# Patient Record
Sex: Female | Born: 1975 | Race: Black or African American | Hispanic: No | Marital: Single | Smoking: Never smoker
Health system: Southern US, Community
[De-identification: ages and names within clinical notes are randomized; demographics above are authoritative.]

## PROBLEM LIST (undated history)

## (undated) ENCOUNTER — Emergency Department (HOSPITAL_COMMUNITY): Discharge status: Absent without leave | Payer: Self-pay

## (undated) DIAGNOSIS — E119 Type 2 diabetes mellitus without complications: Secondary | ICD-10-CM

## (undated) DIAGNOSIS — F419 Anxiety disorder, unspecified: Secondary | ICD-10-CM

## (undated) HISTORY — PX: WISDOM TOOTH EXTRACTION: SHX21

---

## 2003-01-20 ENCOUNTER — Ambulatory Visit (HOSPITAL_COMMUNITY): Admission: RE | Admit: 2003-01-20 | Discharge: 2003-01-20 | Payer: Self-pay | Admitting: Obstetrics and Gynecology

## 2004-10-06 ENCOUNTER — Other Ambulatory Visit: Admission: RE | Admit: 2004-10-06 | Discharge: 2004-10-06 | Payer: Self-pay | Admitting: Obstetrics and Gynecology

## 2012-03-02 ENCOUNTER — Emergency Department (HOSPITAL_BASED_OUTPATIENT_CLINIC_OR_DEPARTMENT_OTHER): Payer: Self-pay

## 2012-03-02 ENCOUNTER — Encounter (HOSPITAL_BASED_OUTPATIENT_CLINIC_OR_DEPARTMENT_OTHER): Payer: Self-pay

## 2012-03-02 ENCOUNTER — Emergency Department (HOSPITAL_BASED_OUTPATIENT_CLINIC_OR_DEPARTMENT_OTHER)
Admission: EM | Admit: 2012-03-02 | Discharge: 2012-03-02 | Disposition: A | Discharge status: Home / Self Care | Payer: Self-pay | Attending: Emergency Medicine | Admitting: Emergency Medicine

## 2012-03-02 DIAGNOSIS — G5600 Carpal tunnel syndrome, unspecified upper limb: Secondary | ICD-10-CM | POA: Insufficient documentation

## 2012-03-02 DIAGNOSIS — Z79899 Other long term (current) drug therapy: Secondary | ICD-10-CM | POA: Insufficient documentation

## 2012-03-02 DIAGNOSIS — E119 Type 2 diabetes mellitus without complications: Secondary | ICD-10-CM | POA: Insufficient documentation

## 2012-03-02 HISTORY — DX: Type 2 diabetes mellitus without complications: E11.9

## 2012-03-02 LAB — BASIC METABOLIC PANEL
BUN: 8 mg/dL (ref 6–23)
CO2: 26 mEq/L (ref 19–32)
Calcium: 9.9 mg/dL (ref 8.4–10.5)
Chloride: 98 mEq/L (ref 96–112)
Creatinine, Ser: 0.8 mg/dL (ref 0.50–1.10)
GFR calc Af Amer: >90 mL/min (ref >90)
GFR calc non Af Amer: >90 mL/min (ref >90)
Glucose, Bld: 113 mg/dL — ABNORMAL HIGH (ref 70–99)
Potassium: 3.9 mEq/L (ref 3.5–5.1)
Sodium: 137 mEq/L (ref 135–145)

## 2012-03-02 LAB — D-DIMER, QUANTITATIVE: D-Dimer, Quant: 0.27 ug/mL-FEU (ref 0.00–0.48)

## 2012-03-02 MED ORDER — IBUPROFEN 800 MG PO TABS
800.0000 mg | ORAL_TABLET | Freq: Once | ORAL | Status: AC
  Administered 2012-03-02: 800 mg via ORAL
  Filled 2012-03-02: qty 1

## 2012-03-02 NOTE — ED Provider Notes (Signed)
History     CSN: [id number]  Arrival date & time 03/02/12  4098   First MD Initiated Contact with Patient 03/02/12 [phone]      Chief Complaint  Patient presents with  . Hand Pain    (Consider location/radiation/quality/duration/timing/severity/associated sxs/prior treatment) HPI Patient with left hand pain with some swelling noted past several nights.  Swelling and pain resolve during day.  Pain at left elbow and radiates to first three fingers.  Pain is worse at night and feels tingly.  Patient shakes wrist to relieve.  Today noted swelling of hand.  No known trauma, no bcp, no history of dvt or pe, no known immobilization or compression of lue.   Past Medical History  Diagnosis Date  . Diabetes mellitus without complication     Past Surgical History  Procedure Date  . Wisdom tooth extraction     History reviewed. No pertinent family history.  History  Substance Use Topics  . Smoking status: Never Smoker   . Smokeless tobacco: Never Used  . Alcohol Use: Yes     Comment: socially    OB History    Grav Para Term Preterm Abortions TAB SAB Ect Mult Living                  Review of Systems  All other systems reviewed and are negative.    Allergies  Percocet; Sulfa antibiotics; Tramadol; and Vicodin  Home Medications   Current Outpatient Rx  Name  Route  Sig  Dispense  Refill  . METFORMIN HCL ER (MOD) 500 MG PO TB24   Oral   Take 500 mg by mouth daily with breakfast.           BP 143/95  Pulse 104  Temp 97.9 F (36.6 C) (Oral)  Resp 20  Ht 5' (1.524 m)  Wt 170 lb (77.111 kg)  BMI 33.20 kg/m2  SpO2 99%  LMP 02/01/2012  Physical Exam  Nursing note and vitals reviewed. Constitutional: She is oriented to person, place, and time. She appears well-developed and well-nourished.  HENT:  Head: Normocephalic and atraumatic.  Eyes: Conjunctivae normal and EOM are normal. Pupils are equal, round, and reactive to light.  Neck: Normal range of motion.    Cardiovascular: Normal rate, regular rhythm and normal heart sounds.   Pulmonary/Chest: Effort normal and breath sounds normal.  Abdominal: Soft. Bowel sounds are normal.  Musculoskeletal:       Mild swelling of dorsal aspect of left hand with difuse ttp, positive tinel's and phalen's sign.  Full arom of elbow and shoulder. Fingers are pink with cap refill < 2 seconds.    Neurological: She is alert and oriented to person, place, and time. She has normal reflexes.  Skin: Skin is warm and dry.  Psychiatric: She has a normal mood and affect. Her behavior is normal. Judgment and thought content normal.    ED Course  Procedures (including critical care time)  Labs Reviewed  BASIC METABOLIC PANEL - Abnormal; Notable for the following:    Glucose, Bld 113 (*)     All other components within normal limits  D-DIMER, QUANTITATIVE   Dg Hand Complete Left  03/02/2012  *RADIOLOGY REPORT*  Clinical Data: Pain  LEFT HAND - COMPLETE 3+ VIEW  Comparison: None.  Findings: Three views of the left hand submitted.  No acute fracture or subluxation.  No periosteal reaction or bony erosion.  IMPRESSION: No acute fracture or subluxation.   Original Report Authenticated By: Natasha Mead,  M.D.      No diagnosis found.    MDM  Patient with symptoms c.w. Carpal tunnel syndrome, but some swelling noted.  Patient had negativ ed-dimer and is placed in cock up splint and advised with referral to Dr Pearletha Forge.       Hilario Quarry, MD 03/02/12 [phone]

## 2012-03-02 NOTE — ED Notes (Signed)
Pt states that she has severe pain in the L hand, pt states that she woke up this morning with numbness and tingling like her hand was asleep.  Pt states that she bought a brace and put it on hand/wrist and it hurt, so she removed it.  Pt states it was swollen this morning, this swelling is still present but has decreased since earlier this morning.

## 2012-03-03 ENCOUNTER — Encounter (HOSPITAL_BASED_OUTPATIENT_CLINIC_OR_DEPARTMENT_OTHER): Payer: Self-pay | Admitting: Emergency Medicine

## 2012-03-03 ENCOUNTER — Emergency Department (HOSPITAL_BASED_OUTPATIENT_CLINIC_OR_DEPARTMENT_OTHER)
Admission: EM | Admit: 2012-03-03 | Discharge: 2012-03-03 | Disposition: A | Discharge status: Home / Self Care | Payer: Self-pay | Attending: Emergency Medicine | Admitting: Emergency Medicine

## 2012-03-03 DIAGNOSIS — G5600 Carpal tunnel syndrome, unspecified upper limb: Secondary | ICD-10-CM | POA: Insufficient documentation

## 2012-03-03 DIAGNOSIS — Z79899 Other long term (current) drug therapy: Secondary | ICD-10-CM | POA: Insufficient documentation

## 2012-03-03 DIAGNOSIS — E119 Type 2 diabetes mellitus without complications: Secondary | ICD-10-CM | POA: Insufficient documentation

## 2012-03-03 LAB — URIC ACID: Uric Acid, Serum: 5.2 mg/dL (ref 2.4–7.0)

## 2012-03-03 MED ORDER — TRAMADOL HCL 50 MG PO TABS: 50.0000 mg | ORAL_TABLET | Freq: Four times a day (QID) | ORAL | Status: AC | PRN

## 2012-03-03 MED ORDER — KETOROLAC TROMETHAMINE 60 MG/2ML IM SOLN
INTRAMUSCULAR | Status: AC
  Administered 2012-03-03: 60 mg via INTRAMUSCULAR
  Filled 2012-03-03: qty 2

## 2012-03-03 MED ORDER — KETOROLAC TROMETHAMINE 60 MG/2ML IM SOLN
60.0000 mg | Freq: Once | INTRAMUSCULAR | Status: AC
  Administered 2012-03-03: 60 mg via INTRAMUSCULAR

## 2012-03-03 MED ORDER — MELOXICAM 7.5 MG PO TABS: 7.5000 mg | ORAL_TABLET | Freq: Every day | ORAL | Status: DC

## 2012-03-03 NOTE — ED Notes (Signed)
Pt educated to take benadryl prior to taking tramadol medication for itching allergy

## 2012-03-03 NOTE — ED Notes (Signed)
Pt continues to complain of left hand pain, no change in pain since patient was seen this am

## 2012-03-03 NOTE — ED Notes (Signed)
Pt requesting to have script for tramadol written for pain control, informed that she has listed tramadol an allergy with itching reported effect. Pt states that the tramadol makes her itch the least out of all pain medications and was informed of possible risk of medication and was able to return acceptance of knowledge of reaction to medication and assumed risk

## 2012-03-03 NOTE — ED Provider Notes (Signed)
History     CSN: [id number]  Arrival date & time 03/03/12  0228   First MD Initiated Contact with Patient 03/03/12 0249      Chief Complaint  Patient presents with  . Hand Pain    (Consider location/radiation/quality/duration/timing/severity/associated sxs/prior treatment) Patient is a 37 y.o. female presenting with hand pain. The history is provided by the patient. No language interpreter was used.  Hand Pain This is a new problem. The current episode started more than 2 days ago. The problem occurs constantly. The problem has not changed since onset.Pertinent negatives include no chest pain, no abdominal pain, no headaches and no shortness of breath. Nothing aggravates the symptoms. Nothing relieves the symptoms. Treatments tried: ibuprofen. The treatment provided no relief.    Past Medical History  Diagnosis Date  . Diabetes mellitus without complication     Past Surgical History  Procedure Date  . Wisdom tooth extraction     History reviewed. No pertinent family history.  History  Substance Use Topics  . Smoking status: Never Smoker   . Smokeless tobacco: Never Used  . Alcohol Use: Yes     Comment: socially    OB History    Grav Para Term Preterm Abortions TAB SAB Ect Mult Living                  Review of Systems  Respiratory: Negative for shortness of breath.   Cardiovascular: Negative for chest pain.  Gastrointestinal: Negative for abdominal pain.  Musculoskeletal: Negative for joint swelling.  Neurological: Negative for headaches.  All other systems reviewed and are negative.    Allergies  Percocet; Sulfa antibiotics; Tramadol; and Vicodin  Home Medications   Current Outpatient Rx  Name  Route  Sig  Dispense  Refill  . METFORMIN HCL ER (MOD) 500 MG PO TB24   Oral   Take 500 mg by mouth daily with breakfast.           BP 148/110  Pulse 88  Temp 98.6 F (37 C)  Resp 18  SpO2 100%  LMP 02/01/2012  Physical Exam  Constitutional: She  is oriented to person, place, and time. She appears well-developed and well-nourished. No distress.  HENT:  Head: Normocephalic and atraumatic.  Eyes: Conjunctivae normal are normal. Pupils are equal, round, and reactive to light.  Neck: Normal range of motion. Neck supple.  Cardiovascular: Normal rate, regular rhythm and intact distal pulses.   Pulmonary/Chest: Effort normal and breath sounds normal. She has no wheezes. She has no rales.  Abdominal: Soft. Bowel sounds are normal. There is no tenderness. There is no rebound.  Musculoskeletal: Normal range of motion. She exhibits no edema.       Left wrist and hand neurovascularly intact.  5/5 strength, cap refill < 2 sec to all digits of the hand.  No swelling warmth or erythema on exam.  No snuff box tenderness of the wrist.  Positive tinel and phalen test  Neurological: She is alert and oriented to person, place, and time.  Skin: Skin is warm and dry. No rash noted. No erythema. No pallor.  Psychiatric: She has a normal mood and affect.    ED Course  Procedures (including critical care time)   Labs Reviewed  URIC ACID   Dg Hand Complete Left  03/02/2012  *RADIOLOGY REPORT*  Clinical Data: Pain  LEFT HAND - COMPLETE 3+ VIEW  Comparison: None.  Findings: Three views of the left hand submitted.  No acute fracture or subluxation.  No periosteal reaction or bony erosion.  IMPRESSION: No acute fracture or subluxation.   Original Report Authenticated By: Natasha Mead, M.D.      No diagnosis found.    MDM  Advised continued use of wrist splint and ice and elevation and use of NSAIDs as first line therapy.  Patient states that ultram causing only mild itching and that she is willing to accept the risk of allergic reaction as pain is severe and will take the medication with benadryl and return for any concerning symptoms.  Will also give RX for meloxicam.  Agree with referral to Dr. Pearletha Forge       Morene Antu Smitty Cords, MD 03/03/12 [phone]

## 2012-03-06 ENCOUNTER — Encounter: Payer: Self-pay | Admitting: Family Medicine

## 2012-03-06 ENCOUNTER — Ambulatory Visit (INDEPENDENT_AMBULATORY_CARE_PROVIDER_SITE_OTHER): Payer: Self-pay | Admitting: Family Medicine

## 2012-03-06 ENCOUNTER — Ambulatory Visit: Payer: Self-pay | Admitting: Family Medicine

## 2012-03-06 VITALS — BP 163/118 | HR 84 | Ht 60.0 in | Wt 175.0 lb

## 2012-03-06 DIAGNOSIS — G5600 Carpal tunnel syndrome, unspecified upper limb: Secondary | ICD-10-CM

## 2012-03-06 DIAGNOSIS — G5602 Carpal tunnel syndrome, left upper limb: Secondary | ICD-10-CM

## 2012-03-06 MED ORDER — MELOXICAM 7.5 MG PO TABS: 7.5000 mg | ORAL_TABLET | Freq: Every day | ORAL | Status: AC

## 2012-03-06 NOTE — Patient Instructions (Addendum)
You have carpal tunnel syndrome. Wear the wrist splint at nighttime and as often as possible during the day Ibuprofen 800 mg three times a day OR meloxicam once a day with food. Corticosteroid injection is a consideration to help with pain and inflammation - you were given this today. If not improving, will consider nerve conduction studies to assess severity. Surgery tends to work well for this if you do not improve with conservative care. Follow up with me in 1 month for reevaluation.

## 2012-03-07 ENCOUNTER — Encounter: Payer: Self-pay | Admitting: Family Medicine

## 2012-03-07 DIAGNOSIS — G5602 Carpal tunnel syndrome, left upper limb: Secondary | ICD-10-CM | POA: Insufficient documentation

## 2012-03-07 NOTE — Assessment & Plan Note (Signed)
Left carpal tunnel syndrome - sustained at work from overuse.  Is using cockup wrist brace, elevation, nsaids.  Went ahead with carpal tunnel injection today.  F/u in 1 month for reevaluation.  May need restrictions at work but she would like to try to continue working without them if possible.    After informed written consent patient was seated in a chair.  Ultrasound used to identify ulnar artery and median nerve then marked injection location between these.  Alcohol swab used to prep area then carpal tunnel injected with 1:1 marcaine: depomedrol.  Patient tolerated procedure well without immediate complications.

## 2012-03-07 NOTE — Progress Notes (Signed)
Subjective:    Patient ID: Kelsey [name], female    DOB: 07/20/1975, 37 y.o.   MRN: [id number]  PCP: None  HPI 37 yo F here for [name] hand pain, numbness.  Patient reports on 1/22 while at work she developed [name] volar wrist pain and [name] hand went numb. She tried shaking hand which helped some. Thursday had same symptoms but fingers went numb except for pinky finger. Is right handed. Does a lot of typing at work. Started wearing a wrist brace on Friday and using this all day. Feels better to use hot water over this. Has been elevating. Tried mobic, tramadol. No neck pain. Difficulty picking items up with this hand.  Past Medical History  Diagnosis Date  . Diabetes mellitus without complication     Current Outpatient Prescriptions on File Prior to Visit  Medication Sig Dispense Refill  . metFORMIN (GLUMETZA) 500 MG (MOD) 24 hr tablet Take 500 mg by mouth daily with breakfast.      . traMADol (ULTRAM) 50 MG tablet Take 1 tablet (50 mg total) by mouth every 6 (six) hours as needed for pain.  9 tablet  0    Past Surgical History  Procedure Date  . Wisdom tooth extraction     Allergies  Allergen Reactions  . Percocet (Oxycodone-Acetaminophen) Itching  . Sulfa Antibiotics Itching  . Tramadol Itching  . Vicodin (Hydrocodone-Acetaminophen) Itching    History   Social History  . Marital Status: Single    Spouse Name: N/A    Number of Children: N/A  . Years of Education: N/A   Occupational History  . Not on file.   Social History Main Topics  . Smoking status: Never Smoker   . Smokeless tobacco: Never Used  . Alcohol Use: Yes     Comment: socially  . Drug Use: Yes    Special: Marijuana  . Sexually Active: Not on file   Other Topics Concern  . Not on file   Social History Narrative  . No narrative on file    Family History  Problem Relation Age of Onset  . Hypertension Mother   . Hyperlipidemia Mother   . Hypertension Father   . Diabetes Father   .  Hyperlipidemia Father   . Hypertension Sister   . Hypertension Brother   . Diabetes Brother   . Heart attack Neg Hx   . Sudden death Neg Hx     BP 163/118  Pulse 84  Ht 5' (1.524 m)  Wt 175 lb (79.379 kg)  BMI 34.18 kg/m2  LMP 02/01/2012  Review of Systems See HPI above.    Objective:   Physical Exam Gen: NAD  L hand/wrist: No gross deformity, swelling, atrophy, bruising. TTP volar wrist over carpal tunnel.  No other forearm, hand, digit tenderness. FROM hand and digits. Pain with finger abduction, thumb opposition, and finger extension though strength 5/5. + tinels and phalens. Sensation diminished to light touch except in 5th digit and ulnar side of 4th digit. Cap refill < 2 secs.  R hand/wrist: FROM without pain. Negative tinels, phalens.    Assessment & Plan:  1. [name] carpal tunnel syndrome - sustained at work from overuse.  Is using cockup wrist brace, elevation, nsaids.  Went ahead with carpal tunnel injection today.  F/u in 1 month for reevaluation.  May need restrictions at work but she would like to try to continue working without them if possible.    After informed written consent patient was seated in a  chair.  Ultrasound used to identify ulnar artery and median nerve then marked injection location between these.  Alcohol swab used to prep area then carpal tunnel injected with 1:1 marcaine: depomedrol.  Patient tolerated procedure well without immediate complications.

## 2012-03-25 ENCOUNTER — Ambulatory Visit (INDEPENDENT_AMBULATORY_CARE_PROVIDER_SITE_OTHER): Payer: Self-pay | Admitting: Family Medicine

## 2012-03-25 ENCOUNTER — Encounter: Payer: Self-pay | Admitting: Family Medicine

## 2012-03-25 VITALS — BP 140/91 | HR 111 | Ht 60.0 in | Wt 170.0 lb

## 2012-03-25 DIAGNOSIS — G5602 Carpal tunnel syndrome, left upper limb: Secondary | ICD-10-CM

## 2012-03-25 DIAGNOSIS — G5600 Carpal tunnel syndrome, unspecified upper limb: Secondary | ICD-10-CM

## 2012-03-26 ENCOUNTER — Encounter: Payer: Self-pay | Admitting: Family Medicine

## 2012-03-26 NOTE — Assessment & Plan Note (Signed)
sustained at work from overuse.  Excellent improvement with cockup wrist splint and injection.  Continue with splint at nighttime and as much as possible during the day.  Note written to allow her hours when it is warmest during day as having cold intolerance.  F/u in 1 month for reevaluation.

## 2012-03-26 NOTE — Progress Notes (Signed)
Subjective:    Patient ID: Kelsey [name], female    DOB: 1975/10/24, 37 y.o.   MRN: [id number]  PCP: None  HPI  37 yo F here for f/u [name] hand pain, numbness.  1/29: Patient reports on 1/22 while at work she developed [name] volar wrist pain and [name] hand went numb. She tried shaking hand which helped some. Thursday had same symptoms but fingers went numb except for pinky finger. Is right handed. Does a lot of typing at work. Started wearing a wrist brace on Friday and using this all day. Feels better to use hot water over this. Has been elevating. Tried mobic, tramadol. No neck pain. Difficulty picking items up with this hand.  2/17: Patient reports injection alleviated her [name] hand pain. Now still having numbness in thumb through ring fingers. Not dropping items. Wearing brace every night and 2 hours on then off during day. No swelling. Using ergonomic keyboard. Bothers her mainly when it's cold (especially when at end of her shift - most people leave by 5pm and she works until 7).  Past Medical History  Diagnosis Date  . Diabetes mellitus without complication     Current Outpatient Prescriptions on File Prior to Visit  Medication Sig Dispense Refill  . meloxicam (MOBIC) 7.5 MG tablet Take 1 tablet (7.5 mg total) by mouth daily. Take on a full stomach  30 tablet  1  . metFORMIN (GLUMETZA) 500 MG (MOD) 24 hr tablet Take 500 mg by mouth daily with breakfast.      . traMADol (ULTRAM) 50 MG tablet Take 1 tablet (50 mg total) by mouth every 6 (six) hours as needed for pain.  9 tablet  0   No current facility-administered medications on file prior to visit.    Past Surgical History  Procedure Laterality Date  . Wisdom tooth extraction      Allergies  Allergen Reactions  . Percocet (Oxycodone-Acetaminophen) Itching  . Sulfa Antibiotics Itching  . Tramadol Itching  . Vicodin (Hydrocodone-Acetaminophen) Itching    History   Social History  . Marital Status: Single    Spouse Name: N/A    Number of Children: N/A  . Years of Education: N/A   Occupational History  . Not on file.   Social History Main Topics  . Smoking status: Never Smoker   . Smokeless tobacco: Never Used  . Alcohol Use: Yes     Comment: socially  . Drug Use: Yes    Special: Marijuana  . Sexually Active: Not on file   Other Topics Concern  . Not on file   Social History Narrative  . No narrative on file    Family History  Problem Relation Age of Onset  . Hypertension Mother   . Hyperlipidemia Mother   . Hypertension Father   . Diabetes Father   . Hyperlipidemia Father   . Hypertension Sister   . Hypertension Brother   . Diabetes Brother   . Heart attack Neg Hx   . Sudden death Neg Hx     BP 140/91  Pulse 111  Ht 5' (1.524 m)  Wt 170 lb (77.111 kg)  BMI 33.2 kg/m2  LMP 02/01/2012  Review of Systems  See HPI above.    Objective:   Physical Exam  Gen: NAD  L hand/wrist: No gross deformity, swelling, atrophy, bruising. No longer with TTP volar wrist over carpal tunnel.  No other forearm, hand, digit tenderness. FROM hand and digits. No pain with finger abduction, thumb opposition, and  finger extension; strength 5/5. Negative tinels and phalens. Sensation diminished to light touch except in 5th digit and ulnar side of 4th digit. Cap refill < 2 secs.     Assessment & Plan:  1. [name] carpal tunnel syndrome - sustained at work from overuse.  Excellent improvement with cockup wrist splint and injection.  Continue with splint at nighttime and as much as possible during the day.  Note written to allow her hours when it is warmest during day as having cold intolerance.  F/u in 1 month for reevaluation.

## 2012-04-03 ENCOUNTER — Ambulatory Visit: Payer: Self-pay | Admitting: Family Medicine

## 2012-05-06 ENCOUNTER — Ambulatory Visit: Payer: Self-pay | Admitting: Family Medicine

## 2012-10-08 ENCOUNTER — Other Ambulatory Visit: Payer: Self-pay

## 2012-10-08 ENCOUNTER — Encounter (HOSPITAL_BASED_OUTPATIENT_CLINIC_OR_DEPARTMENT_OTHER): Payer: Self-pay | Admitting: *Deleted

## 2012-10-08 ENCOUNTER — Emergency Department (HOSPITAL_BASED_OUTPATIENT_CLINIC_OR_DEPARTMENT_OTHER)
Admission: EM | Admit: 2012-10-08 | Discharge: 2012-10-08 | Disposition: A | Discharge status: Home / Self Care | Payer: Self-pay | Attending: Emergency Medicine | Admitting: Emergency Medicine

## 2012-10-08 DIAGNOSIS — F341 Dysthymic disorder: Secondary | ICD-10-CM | POA: Insufficient documentation

## 2012-10-08 DIAGNOSIS — Z880 Allergy status to penicillin: Secondary | ICD-10-CM | POA: Insufficient documentation

## 2012-10-08 DIAGNOSIS — F418 Other specified anxiety disorders: Secondary | ICD-10-CM

## 2012-10-08 DIAGNOSIS — Z79899 Other long term (current) drug therapy: Secondary | ICD-10-CM | POA: Insufficient documentation

## 2012-10-08 DIAGNOSIS — IMO0002 Reserved for concepts with insufficient information to code with codable children: Secondary | ICD-10-CM | POA: Insufficient documentation

## 2012-10-08 DIAGNOSIS — E119 Type 2 diabetes mellitus without complications: Secondary | ICD-10-CM | POA: Insufficient documentation

## 2012-10-08 HISTORY — DX: Anxiety disorder, unspecified: F41.9

## 2012-10-08 MED ORDER — CITALOPRAM HYDROBROMIDE 40 MG PO TABS: 40.0000 mg | ORAL_TABLET | Freq: Every day | ORAL | Status: AC

## 2012-10-08 NOTE — ED Provider Notes (Signed)
Medical screening examination/treatment/procedure(s) were performed by non-physician practitioner and as supervising physician I was immediately available for consultation/collaboration.   Tammi Boulier H Brae Schaafsma, MD 10/08/12 1825 

## 2012-10-08 NOTE — ED Provider Notes (Signed)
CSN: [id number]     Arrival date & time 10/08/12  1701 History   First MD Initiated Contact with Patient 10/08/12 1720     Chief Complaint  Patient presents with  . Panic Attack   (Consider location/radiation/quality/duration/timing/severity/associated sxs/prior Treatment) HPI Comments: Pt states that she has a history of depression:pt states that her psychiatrist moved away and she hasn't been taking her medications for the last couple of months:pt states that she has a lot of stressors in her life and she feels like the last couple of weeks she has been in bed a lot and crying and has episodes of feeling agitated  The history is provided by the patient. No language interpreter was used.    Past Medical History  Diagnosis Date  . Diabetes mellitus without complication   . Anxiety    Past Surgical History  Procedure Laterality Date  . Wisdom tooth extraction     Family History  Problem Relation Age of Onset  . Hypertension Mother   . Hyperlipidemia Mother   . Hypertension Father   . Diabetes Father   . Hyperlipidemia Father   . Hypertension Sister   . Hypertension Brother   . Diabetes Brother   . Heart attack Neg Hx   . Sudden death Neg Hx    History  Substance Use Topics  . Smoking status: Never Smoker   . Smokeless tobacco: Never Used  . Alcohol Use: Yes     Comment: socially   OB History   Grav Para Term Preterm Abortions TAB SAB Ect Mult Living                 Review of Systems  Constitutional: Negative.   Respiratory: Negative.   Cardiovascular: Negative.     Allergies  Ampicillin; Percocet; Sulfa antibiotics; Tramadol; and Vicodin  Home Medications   Current Outpatient Rx  Name  Route  Sig  Dispense  Refill  . citalopram (CELEXA) 40 MG tablet   Oral   Take 1 tablet (40 mg total) by mouth daily.   30 tablet   0   . meloxicam (MOBIC) 7.5 MG tablet   Oral   Take 1 tablet (7.5 mg total) by mouth daily. Take on a full stomach   30 tablet   1   .  metFORMIN (GLUMETZA) 500 MG (MOD) 24 hr tablet   Oral   Take 500 mg by mouth daily with breakfast.         . traMADol (ULTRAM) 50 MG tablet   Oral   Take 1 tablet (50 mg total) by mouth every 6 (six) hours as needed for pain.   9 tablet   0    BP 166/101  Pulse 104  Temp(Src) 98.4 F (36.9 C) (Oral)  Resp 20  SpO2 100%  LMP 10/01/2012 Physical Exam  Nursing note and vitals reviewed. Constitutional: She is oriented to person, place, and time. She appears well-developed and well-nourished.  HENT:  Head: Normocephalic and atraumatic.  Cardiovascular: Normal rate and regular rhythm.   Pulmonary/Chest: Effort normal and breath sounds normal.  Musculoskeletal: Normal range of motion.  Neurological: She is oriented to person, place, and time.  Skin: Skin is warm and dry.  Psychiatric: She expresses no homicidal and no suicidal ideation. She expresses no suicidal plans and no homicidal plans.    ED Course  Procedures (including critical care time) Labs Review Labs Reviewed - No data to display Imaging Review No results found.  Date: 10/08/2012  Rate:  101  Rhythm: sinus tachycardia  QRS Axis: normal  Intervals: normal  ST/T Wave abnormalities: normal  Conduction Disutrbances:none  Narrative Interpretation:   Old EKG Reviewed: none available    MDM   1. Depression with anxiety    Pt is not si/hi:pt started back on her celexa:called pharmacy to verify previous dose    Teressa Lower, NP 10/08/12 1754

## 2012-10-08 NOTE — ED Notes (Signed)
Pt amb to triage with quick steady gait smiling in nad. Pt states "I am having a panic attack, but I'm trying to be calm so I don't bust out crying right now.." resp easy and reg.  States she feels jittery.

## 2015-03-19 ENCOUNTER — Emergency Department (HOSPITAL_BASED_OUTPATIENT_CLINIC_OR_DEPARTMENT_OTHER): Payer: Self-pay

## 2015-03-19 ENCOUNTER — Emergency Department (HOSPITAL_BASED_OUTPATIENT_CLINIC_OR_DEPARTMENT_OTHER)
Admission: EM | Admit: 2015-03-19 | Discharge: 2015-03-19 | Disposition: A | Discharge status: Home / Self Care | Payer: Self-pay | Attending: Emergency Medicine | Admitting: Emergency Medicine

## 2015-03-19 ENCOUNTER — Encounter (HOSPITAL_BASED_OUTPATIENT_CLINIC_OR_DEPARTMENT_OTHER): Payer: Self-pay | Admitting: *Deleted

## 2015-03-19 DIAGNOSIS — Z880 Allergy status to penicillin: Secondary | ICD-10-CM | POA: Insufficient documentation

## 2015-03-19 DIAGNOSIS — J069 Acute upper respiratory infection, unspecified: Secondary | ICD-10-CM | POA: Insufficient documentation

## 2015-03-19 DIAGNOSIS — Z79899 Other long term (current) drug therapy: Secondary | ICD-10-CM | POA: Insufficient documentation

## 2015-03-19 DIAGNOSIS — E119 Type 2 diabetes mellitus without complications: Secondary | ICD-10-CM | POA: Insufficient documentation

## 2015-03-19 DIAGNOSIS — J040 Acute laryngitis: Secondary | ICD-10-CM | POA: Insufficient documentation

## 2015-03-19 DIAGNOSIS — Z7984 Long term (current) use of oral hypoglycemic drugs: Secondary | ICD-10-CM | POA: Insufficient documentation

## 2015-03-19 DIAGNOSIS — F419 Anxiety disorder, unspecified: Secondary | ICD-10-CM | POA: Insufficient documentation

## 2015-03-19 MED ORDER — IPRATROPIUM-ALBUTEROL 0.5-2.5 (3) MG/3ML IN SOLN
3.0000 mL | Freq: Four times a day (QID) | RESPIRATORY_TRACT | Status: DC
  Administered 2015-03-19: 3 mL via RESPIRATORY_TRACT
  Filled 2015-03-19: qty 3

## 2015-03-19 NOTE — ED Notes (Signed)
Cough, wheezing x 1 week.

## 2015-03-19 NOTE — ED Provider Notes (Signed)
CSN: [id number]     Arrival date & time 03/19/15  1311 History   First MD Initiated Contact with Patient 03/19/15 1545     Chief Complaint  Patient presents with  . Cough     (Consider location/radiation/quality/duration/timing/severity/associated sxs/prior Treatment) HPI Comments: 1 week history of coughing and wheezing. She does not have asthma is not a smoker. She was seen by someone at her job clinic and was given doxycycline for "early pneumonia". She returns today with worsening cough and wheezing and laryngitis. She lost her voice over the past 2 days. She endorses cough and shortness of breath that is worse with lying down. Some chest pain with coughing. No fever. No abdominal pain, nausea or vomiting. Some rhinorrhea and sore throat. Has had sick contacts at work.  The history is provided by the patient.    Past Medical History  Diagnosis Date  . Diabetes mellitus without complication (HCC)   . Anxiety    Past Surgical History  Procedure Laterality Date  . Wisdom tooth extraction     Family History  Problem Relation Age of Onset  . Hypertension Mother   . Hyperlipidemia Mother   . Hypertension Father   . Diabetes Father   . Hyperlipidemia Father   . Hypertension Sister   . Hypertension Brother   . Diabetes Brother   . Heart attack Neg Hx   . Sudden death Neg Hx    Social History  Substance Use Topics  . Smoking status: Never Smoker   . Smokeless tobacco: Never Used  . Alcohol Use: Yes     Comment: socially   OB History    No data available     Review of Systems  Constitutional: Negative for fever, activity change, appetite change and fatigue.  HENT: Positive for congestion, rhinorrhea, sore throat and voice change.   Respiratory: Positive for cough.   Cardiovascular: Negative for chest pain.  Gastrointestinal: Negative for nausea, vomiting and abdominal pain.  Genitourinary: Negative for dysuria, hematuria, vaginal bleeding and vaginal discharge.   Musculoskeletal: Negative for myalgias and arthralgias.  Skin: Negative for rash.  Neurological: Negative for dizziness, weakness and headaches.  A complete 10 system review of systems was obtained and all systems are negative except as noted in the HPI and PMH.      Allergies  Ampicillin; Percocet; Sulfa antibiotics; Tramadol; and Vicodin  Home Medications   Prior to Admission medications   Medication Sig Start Date End Date Taking? Authorizing Provider  citalopram (CELEXA) 40 MG tablet Take 1 tablet (40 mg total) by mouth daily. 10/08/12   Teressa Lower, NP  meloxicam (MOBIC) 7.5 MG tablet Take 1 tablet (7.5 mg total) by mouth daily. Take on a full stomach 03/06/12   Lenda Kelp, MD  metFORMIN (GLUMETZA) 500 MG (MOD) 24 hr tablet Take 500 mg by mouth daily with breakfast.    Historical Provider, MD  traMADol (ULTRAM) 50 MG tablet Take 1 tablet (50 mg total) by mouth every 6 (six) hours as needed for pain. 03/03/12   April Palumbo, MD   BP 134/97 mmHg  Pulse 125  Temp(Src) 98.5 F (36.9 C) (Oral)  Resp 18  Ht 5' (1.524 m)  Wt 165 lb (74.844 kg)  BMI 32.22 kg/m2  SpO2 99%  LMP 03/17/2015 Physical Exam  Constitutional: She is oriented to person, place, and time. She appears well-developed and well-nourished. No distress.  Anxious, hoarse voice  HENT:  Head: Normocephalic and atraumatic.  Mouth/Throat: Oropharynx is clear and  moist. No oropharyngeal exudate.  Eyes: Conjunctivae and EOM are normal. Pupils are equal, round, and reactive to light.  Neck: Normal range of motion. Neck supple.  No meningismus.  Cardiovascular: Normal rate, normal heart sounds and intact distal pulses.   No murmur heard. tachycardic  Pulmonary/Chest: Effort normal and breath sounds normal. No respiratory distress.  Scant expiratory wheezing  Abdominal: Soft. There is no tenderness. There is no rebound and no guarding.  Musculoskeletal: Normal range of motion. She exhibits no edema or  tenderness.  Neurological: She is alert and oriented to person, place, and time. No cranial nerve deficit. She exhibits normal muscle tone. Coordination normal.  No ataxia on finger to nose bilaterally. No pronator drift. 5/5 strength throughout. CN 2-12 intact.Equal grip strength. Sensation intact.   Skin: Skin is warm.  Psychiatric: She has a normal mood and affect. Her behavior is normal.  Nursing note and vitals reviewed.   ED Course  Procedures (including critical care time) Labs Review Labs Reviewed - No data to display  Imaging Review Dg Neck Soft Tissue  03/19/2015  CLINICAL DATA:  40 year old female shortness of breath and hoarseness. EXAM: NECK SOFT TISSUES - 1+ VIEW COMPARISON:  None. FINDINGS: There is no evidence of retropharyngeal soft tissue swelling or epiglottic enlargement. The cervical airway is unremarkable and no radio-opaque foreign body identified. Degenerative disc disease and spondylosis noted, mild to moderate at C4-5. IMPRESSION: No soft tissue abnormalities identified. 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 Electronically Signed   By: Harmon Pier M.D.   On: 03/19/2015 16:19   Dg Chest 2 View  03/19/2015  CLINICAL DATA:  Cough for 5 days with runny nose. EXAM: CHEST  2 VIEW COMPARISON:  None. FINDINGS: The cardiomediastinal silhouette is within normal limits. There is mild elevation of the right hemidiaphragm. The lungs are clear. No pleural effusion or pneumothorax is seen. No acute osseous abnormality is identified. IMPRESSION: No active cardiopulmonary disease. Electronically Signed   By: Sebastian Ache M.D.   On: 03/19/2015 13:40   I have personally reviewed and evaluated these images and lab results as part of my medical decision-making.   EKG Interpretation None      MDM   Final diagnoses:  Laryngitis  Upper respiratory infection   1 week of cough, shortness of breath, 2 days of hoarse voice. Anxious appearing.  Lungs are  clear with minimal scattered expiratory wheezing. No chest pain. Chest x-rays negative. Neck x-ray negative for foreign body or other pathology.  presentation is consistent with a viral respiratory illness with laryngitis. She was given doxycycline 2 days ago for unclear reason and told "early pneumonia". She will continue this and follow up with her regular doctor. She is tachycardic after albuterol. She is in no respiratory distress and oxygenating 99% on RA.  Glynn Octave, MD 03/19/15 [phone]

## 2015-03-19 NOTE — Discharge Instructions (Signed)
Laryngitis  Laryngitis is inflammation of your vocal cords. This causes hoarseness, coughing, loss of voice, sore throat, or a dry throat. Your vocal cords are two bands of muscles that are found in your throat. When you speak, these cords come together and vibrate. These vibrations come out through your mouth as sound. When your vocal cords are inflamed, your voice sounds different.  Laryngitis can be temporary (acute) or long-term (chronic). Most cases of acute laryngitis improve with time. Chronic laryngitis is laryngitis that lasts for more than three weeks.  CAUSES  Acute laryngitis may be caused by:   A viral infection.   Lots of talking, yelling, or singing. This is also called vocal strain.   Bacterial infections.  Chronic laryngitis may be caused by:   Vocal strain.   Injury to your vocal cords.   Acid reflux (gastroesophageal reflux disease or GERD).   Allergies.   Sinus infection.   Smoking.   Alcohol abuse.   Breathing in chemicals or dust.   Growths on the vocal cords.  RISK FACTORS  Risk factors for laryngitis include:   Smoking.   Alcohol abuse.   Having allergies.  SIGNS AND SYMPTOMS  Symptoms of laryngitis may include:   Low, hoarse voice.   Loss of voice.   Dry cough.   Sore throat.   Stuffy nose.  DIAGNOSIS  Laryngitis may be diagnosed by:   Physical exam.   Throat culture.   Blood test.   Laryngoscopy. This procedure allows your health care provider to look at your vocal cords with a mirror or viewing tube.  TREATMENT  Treatment for laryngitis depends on what is causing it. Usually, treatment involves resting your voice and using medicines to soothe your throat. However, if your laryngitis is caused by a bacterial infection, you may need to take antibiotic medicine. If your laryngitis is caused by a growth, you may need to have a procedure to remove it.  HOME CARE INSTRUCTIONS   Drink enough fluid to keep your urine clear or pale yellow.   Breathe in moist air. Use a  humidifier if you live in a dry climate.   Take medicines only as directed by your health care provider.   If you were prescribed an antibiotic medicine, finish it all even if you start to feel better.   Do not smoke cigarettes or electronic cigarettes. If you need help quitting, ask your health care provider.   Talk as little as possible. Also avoid whispering, which can cause vocal strain.   Write instead of talking. Do this until your voice is back to normal.  SEEK MEDICAL CARE IF:   You have a fever.   You have increasing pain.   You have difficulty swallowing.  SEEK IMMEDIATE MEDICAL CARE IF:   You cough up blood.   You have trouble breathing.     This information is not intended to replace advice given to you by your health care provider. Make sure you discuss any questions you have with your health care provider.     Document Released: 01/23/2005 Document Revised: 02/13/2014 Document Reviewed: 07/08/2013  Elsevier Interactive Patient Education 2016 [employer]

## 2017-08-25 IMAGING — CR DG CHEST 2V
2 series · 2 of 2 positions shown · non-contrast
Comparison: None.

CLINICAL DATA: Cough for 5 days with runny nose.

EXAM:
CHEST  2 VIEW

[w chest pa]
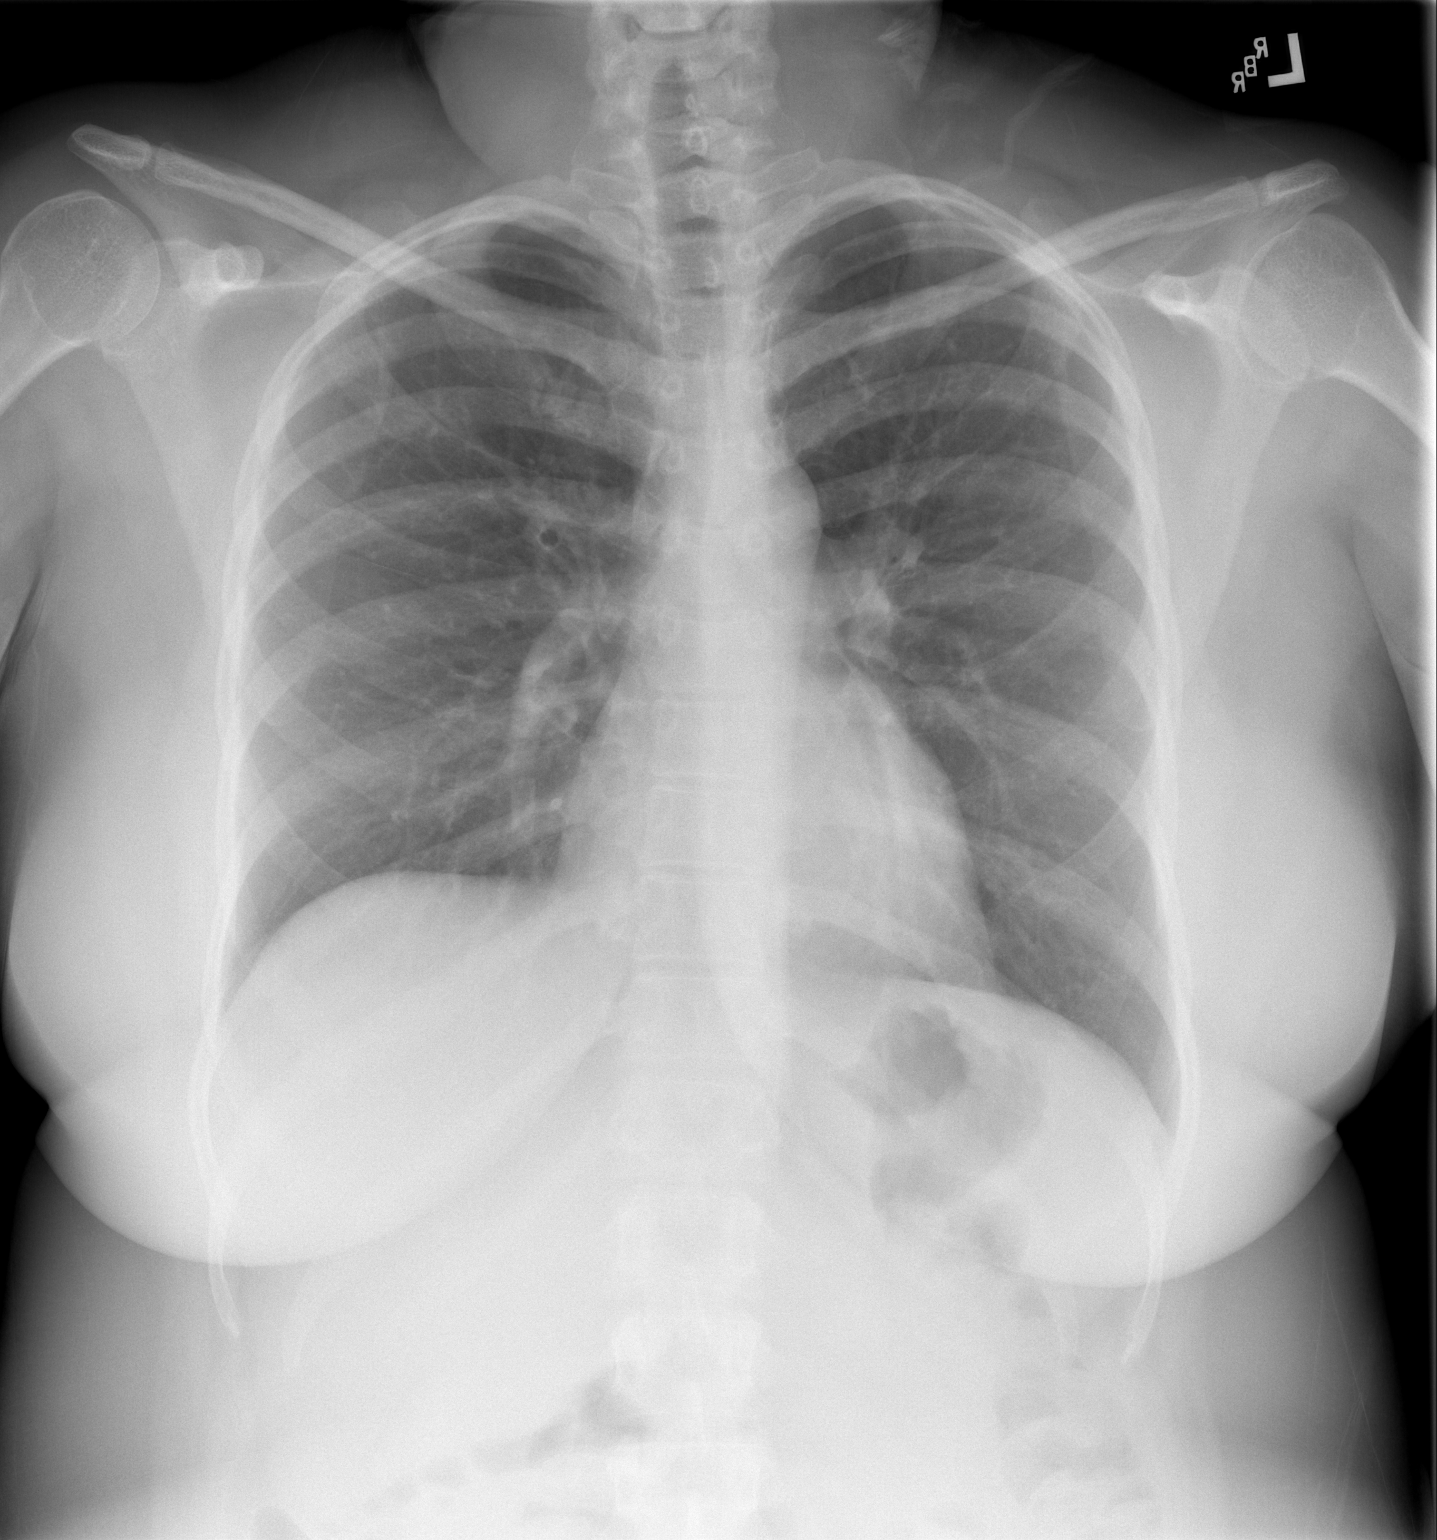

[w chest lat]
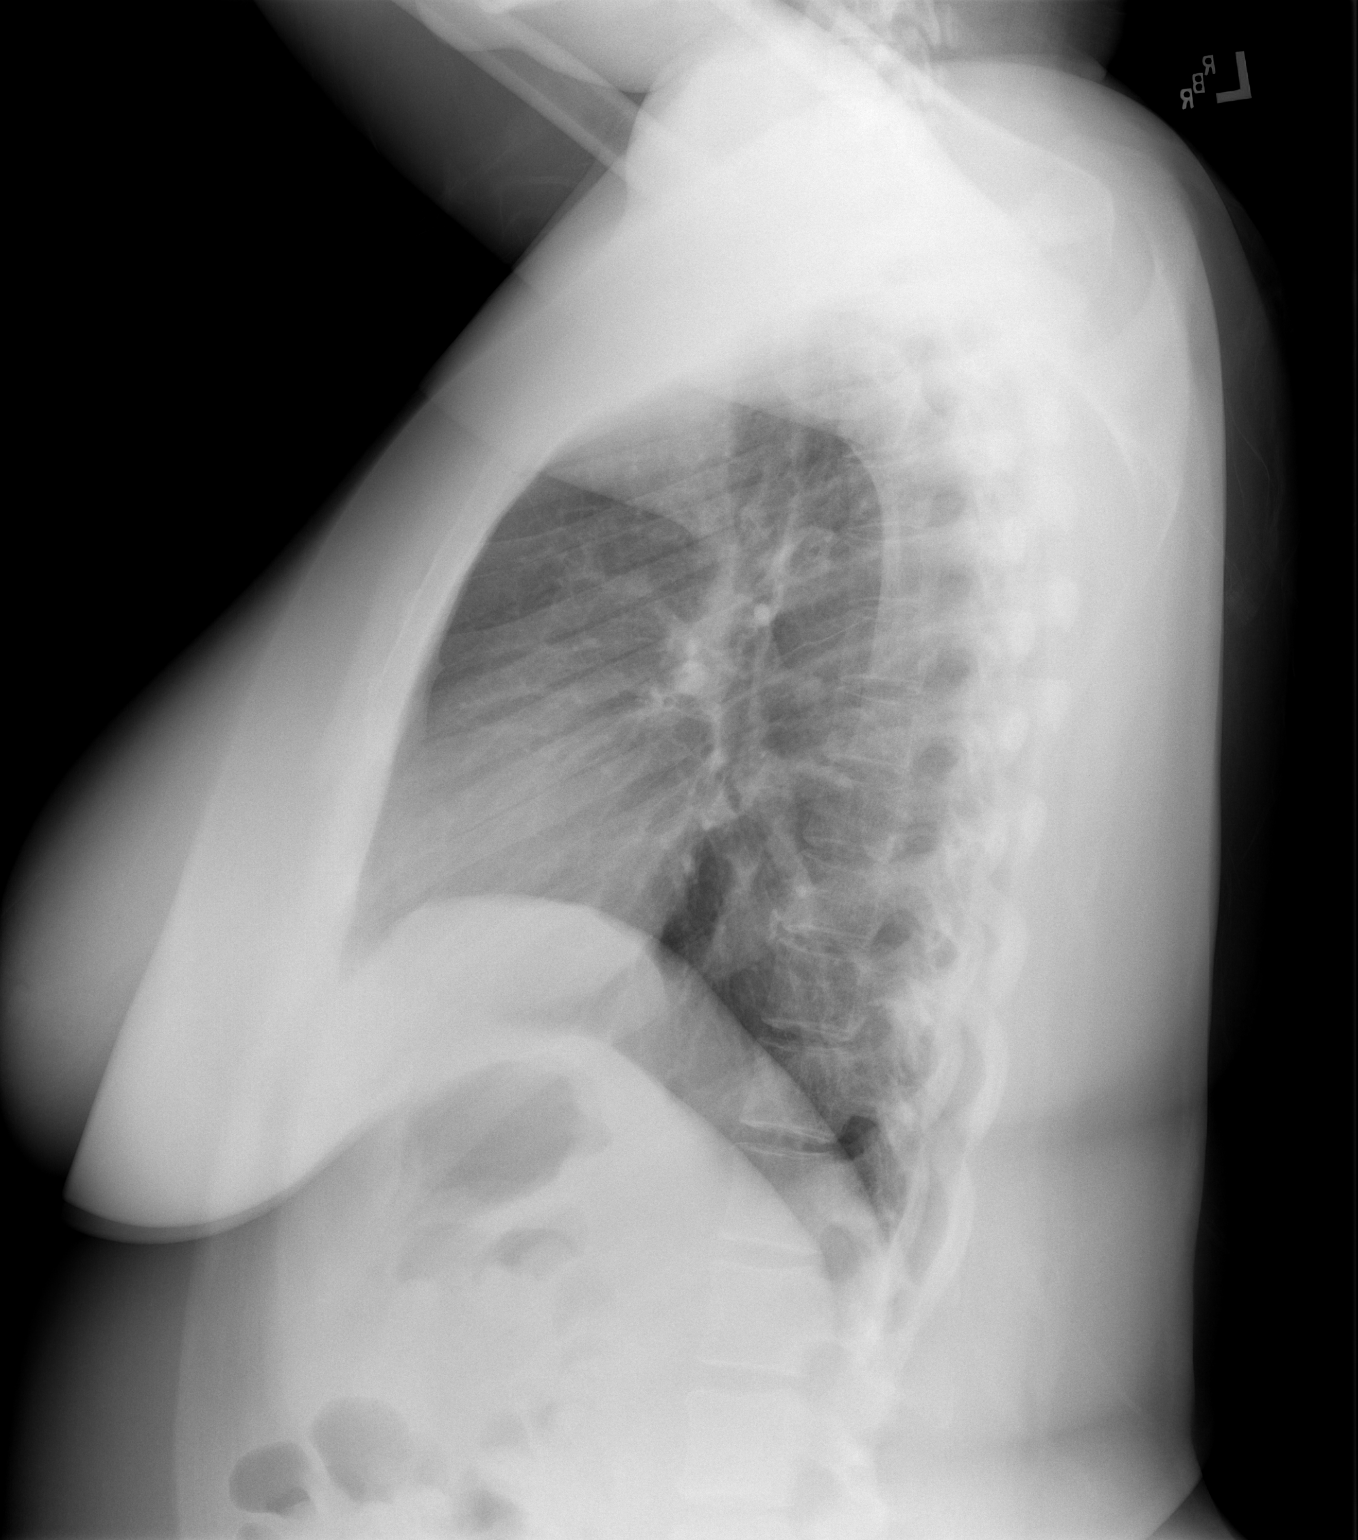

[2 of 2 positions shown; findings below may reference images not displayed]

FINDINGS: The cardiomediastinal silhouette is within normal limits. There is
mild elevation of the right hemidiaphragm. The lungs are clear. No
pleural effusion or pneumothorax is seen. No acute osseous
abnormality is identified.
IMPRESSION: No active cardiopulmonary disease.

## 2019-03-07 ENCOUNTER — Emergency Department (HOSPITAL_BASED_OUTPATIENT_CLINIC_OR_DEPARTMENT_OTHER): Payer: BC Managed Care – PPO

## 2019-03-07 ENCOUNTER — Other Ambulatory Visit: Payer: Self-pay

## 2019-03-07 ENCOUNTER — Encounter (HOSPITAL_BASED_OUTPATIENT_CLINIC_OR_DEPARTMENT_OTHER): Payer: Self-pay | Admitting: Emergency Medicine

## 2019-03-07 ENCOUNTER — Emergency Department (HOSPITAL_BASED_OUTPATIENT_CLINIC_OR_DEPARTMENT_OTHER)
Admission: EM | Admit: 2019-03-07 | Discharge: 2019-03-07 | Disposition: A | Discharge status: Home / Self Care | Payer: BC Managed Care – PPO | Attending: Emergency Medicine | Admitting: Emergency Medicine

## 2019-03-07 DIAGNOSIS — E119 Type 2 diabetes mellitus without complications: Secondary | ICD-10-CM | POA: Diagnosis not present

## 2019-03-07 DIAGNOSIS — Z882 Allergy status to sulfonamides status: Secondary | ICD-10-CM | POA: Insufficient documentation

## 2019-03-07 DIAGNOSIS — Z880 Allergy status to penicillin: Secondary | ICD-10-CM | POA: Diagnosis not present

## 2019-03-07 DIAGNOSIS — Z885 Allergy status to narcotic agent status: Secondary | ICD-10-CM | POA: Diagnosis not present

## 2019-03-07 DIAGNOSIS — B349 Viral infection, unspecified: Secondary | ICD-10-CM

## 2019-03-07 DIAGNOSIS — M7918 Myalgia, other site: Secondary | ICD-10-CM | POA: Diagnosis present

## 2019-03-07 DIAGNOSIS — J040 Acute laryngitis: Secondary | ICD-10-CM | POA: Diagnosis not present

## 2019-03-07 DIAGNOSIS — Z794 Long term (current) use of insulin: Secondary | ICD-10-CM | POA: Diagnosis not present

## 2019-03-07 DIAGNOSIS — Z79899 Other long term (current) drug therapy: Secondary | ICD-10-CM | POA: Insufficient documentation

## 2019-03-07 DIAGNOSIS — Z20822 Contact with and (suspected) exposure to covid-19: Secondary | ICD-10-CM | POA: Insufficient documentation

## 2019-03-07 DIAGNOSIS — M791 Myalgia, unspecified site: Secondary | ICD-10-CM

## 2019-03-07 LAB — URINALYSIS, ROUTINE W REFLEX MICROSCOPIC
Bilirubin Urine: NEGATIVE
Glucose, UA: >=500 mg/dL — AB
Ketones, ur: NEGATIVE mg/dL
Leukocytes,Ua: NEGATIVE
Nitrite: NEGATIVE
Protein, ur: NEGATIVE mg/dL
Specific Gravity, Urine: 1.01 (ref 1.005–1.030)
pH: 7 (ref 5.0–8.0)

## 2019-03-07 LAB — URINALYSIS, MICROSCOPIC (REFLEX)

## 2019-03-07 LAB — PREGNANCY, URINE: Preg Test, Ur: NEGATIVE

## 2019-03-07 LAB — SARS CORONAVIRUS 2 (TAT 6-24 HRS): SARS Coronavirus 2: NEGATIVE

## 2019-03-07 LAB — GROUP A STREP BY PCR: Group A Strep by PCR: NOT DETECTED

## 2019-03-07 NOTE — ED Triage Notes (Signed)
Pt c/o body aches and chills since Tues

## 2019-03-07 NOTE — Discharge Instructions (Addendum)
1.  You may take over-the-counter Tylenol and decongestants for your symptoms. 2.  Your Covid test result should be available in the next 6 to 24 hours.  Follow instructions for quarantine and avoiding others to the results. 3.  Return to the emergency department if your symptoms are worsening or new symptoms are developing.

## 2019-03-07 NOTE — ED Provider Notes (Signed)
[facility] EMERGENCY DEPARTMENT Provider Note   CSN: [id number] Arrival date & time: 03/07/19  2778     History Chief Complaint  Patient presents with  . Generalized Body Aches    Kelsey [name] is a 44 y.o. female.  HPI Patient reports she started feeling sick Tuesday, 3 days ago.  Symptoms have been predominantly myalgias and fatigue.  Today she developed some laryngitis.  She denies she has a sore throat but her voice is hoarse.  Initially she thought the symptoms were due to allergies with some sinus congestion and drainage she is trying her over-the-counter sinus medication without improvement.  She reports a mild cough which again, she attributed to allergies.  Patient denies any documented fever.  No abdominal pain, no vomiting no diarrhea.  No pain burning or urgency with urination.  Patient does have several family members who seemed to pass a cold around and she thinks she might of caught it from them.  She reports they all tested negative for Covid.  Past Medical History:  Diagnosis Date  . Anxiety   . Diabetes mellitus without complication [facility])     Patient Active Problem List   Diagnosis Date Noted  . Carpal tunnel syndrome of left wrist 03/07/2012    Past Surgical History:  Procedure Laterality Date  . WISDOM TOOTH EXTRACTION       OB History   No obstetric history on file.     Family History  Problem Relation Age of Onset  . Hypertension Mother   . Hyperlipidemia Mother   . Hypertension Father   . Diabetes Father   . Hyperlipidemia Father   . Hypertension Sister   . Hypertension Brother   . Diabetes Brother   . Heart attack Neg Hx   . Sudden death Neg Hx     Social History   Tobacco Use  . Smoking status: Never Smoker  . Smokeless tobacco: Never Used  Substance Use Topics  . Alcohol use: Yes    Comment: socially  . Drug use: Not Currently    Home Medications Prior to Admission medications   Medication Sig Start Date End Date  Taking? Authorizing Provider  magnesium oxide (MAG-OX) 400 MG tablet Take by mouth. 04/02/18  Yes [provider]  atorvastatin (LIPITOR) 80 MG tablet Take 80 mg by mouth daily. 12/19/18   [provider]  citalopram (CELEXA) 40 MG tablet Take 1 tablet (40 mg total) by mouth daily. 10/08/12   Glendell Docker, NP  hydrochlorothiazide (HYDRODIURIL) 12.5 MG tablet Take 12.5 mg by mouth daily. 01/24/19   [provider]  JARDIANCE 10 MG TABS tablet Take 10 mg by mouth daily. 02/15/19   [provider]  LANTUS SOLOSTAR 100 UNIT/ML Solostar Pen SMARTSIG:0-50 Unit(s) SUB-Q Daily 11/01/18   [provider]  meloxicam (MOBIC) 7.5 MG tablet Take 1 tablet (7.5 mg total) by mouth daily. Take on a full stomach 03/06/12   Hudnall, Sharyn Lull, MD  metFORMIN (GLUMETZA) 500 MG (MOD) 24 hr tablet Take 500 mg by mouth daily with breakfast.    [provider]  OXcarbazepine (TRILEPTAL) 150 MG tablet Take 150 mg by mouth 2 (two) times daily. 02/19/19   [provider]  traMADol (ULTRAM) 50 MG tablet Take 1 tablet (50 mg total) by mouth every 6 (six) hours as needed for pain. 03/03/12   Palumbo, April, MD    Allergies    Ampicillin, Percocet [oxycodone-acetaminophen], Sulfa antibiotics, Tramadol, and Vicodin [hydrocodone-acetaminophen]  Review of Systems  Review of Systems 10 Systems reviewed and are negative for acute change except as noted in the HPI. Physical Exam Updated Vital Signs BP (!) 150/98   Pulse 84   Temp 98.1 F (36.7 C) (Oral)   Resp 16   Ht 5' (1.524 m)   Wt 65.8 kg   LMP 02/20/2019   SpO2 100%   BMI 28.32 kg/m   Physical Exam Constitutional:      Appearance: She is well-developed.  HENT:     Head: Normocephalic and atraumatic.     Mouth/Throat:     Mouth: Mucous membranes are moist.     Pharynx: Oropharynx is clear.  Eyes:     Extraocular Movements: Extraocular movements intact.     Conjunctiva/sclera: Conjunctivae normal.      Pupils: Pupils are equal, round, and reactive to light.  Cardiovascular:     Rate and Rhythm: Normal rate and regular rhythm.     Heart sounds: Normal heart sounds.  Pulmonary:     Effort: Pulmonary effort is normal.     Breath sounds: Normal breath sounds.  Abdominal:     General: Bowel sounds are normal. There is no distension.     Palpations: Abdomen is soft.     Tenderness: There is no abdominal tenderness.  Musculoskeletal:        General: Normal range of motion.     Cervical back: Neck supple.  Skin:    General: Skin is warm and dry.  Neurological:     General: No focal deficit present.     Mental Status: She is alert and oriented to person, place, and time.     GCS: GCS eye subscore is 4. GCS verbal subscore is 5. GCS motor subscore is 6.     Coordination: Coordination normal.  Psychiatric:        Mood and Affect: Mood normal.     ED Results / Procedures / Treatments   Labs (all labs ordered are listed, but only abnormal results are displayed) Labs Reviewed  URINALYSIS, ROUTINE W REFLEX MICROSCOPIC - Abnormal; Notable for the following components:      Result Value   Glucose, UA >=500 (*)    Hgb urine dipstick TRACE (*)    All other components within normal limits  URINALYSIS, MICROSCOPIC (REFLEX) - Abnormal; Notable for the following components:   Bacteria, UA FEW (*)    All other components within normal limits  GROUP A STREP BY PCR  SARS CORONAVIRUS 2 (TAT 6-24 HRS)  PREGNANCY, URINE    EKG None  Radiology DG Chest Port 1 View  Result Date: 03/07/2019 CLINICAL DATA:  44 year old female with a history of myalgia EXAM: PORTABLE CHEST 1 VIEW COMPARISON:  03/19/2015 FINDINGS: Cardiomediastinal silhouette unchanged in size and contour. No pneumothorax or pleural effusion. No confluent airspace disease. No displaced fracture IMPRESSION: Negative for acute cardiopulmonary disease Electronically Signed   By: Gilmer Mor D.O.   On: 03/07/2019 11:15     Procedures Procedures (including critical care time)  Medications Ordered in ED Medications - No data to display  ED Course  I have reviewed the triage vital signs and the nursing notes.  Pertinent labs & imaging results that were available during my care of the patient were reviewed by me and considered in my medical decision making (see chart for details).    MDM Rules/Calculators/A&P                      Patient has  had generalized constitutional symptoms with body aches and fatigue.  Mild cough and now laryngitis.  No chest pain, no shortness of breath.  No abdominal pain no vomiting or diarrhea.  Patient is well in appearance today.  Chest x-ray shows no infiltrates and vital signs are stable.  Patient is given precautions for Covid.  Testing has been obtained.  Patient is stable for discharge with symptomatic treatment.  Return precautions reviewed.  Denys Labree was evaluated in Emergency Department on 03/07/2019 for the symptoms described in the history of present illness. She was evaluated in the context of the global COVID-19 pandemic, which necessitated consideration that the patient might be at risk for infection with the SARS-CoV-2 virus that causes COVID-19. Institutional protocols and algorithms that pertain to the evaluation of patients at risk for COVID-19 are in a state of rapid change based on information released by regulatory bodies including the CDC and federal and state organizations. These policies and algorithms were followed during the patient's care in the ED. Final Clinical Impression(s) / ED Diagnoses Final diagnoses:  Viral illness  Myalgia  Laryngitis    Rx / DC Orders ED Discharge Orders    None       Arby Barrette, MD 03/07/19 1151

## 2019-11-14 ENCOUNTER — Encounter (HOSPITAL_BASED_OUTPATIENT_CLINIC_OR_DEPARTMENT_OTHER): Payer: Self-pay

## 2019-11-14 ENCOUNTER — Emergency Department (HOSPITAL_BASED_OUTPATIENT_CLINIC_OR_DEPARTMENT_OTHER)
Admission: EM | Admit: 2019-11-14 | Discharge: 2019-11-14 | Disposition: A | Discharge status: Home / Self Care | Payer: HRSA Program | Attending: Emergency Medicine | Admitting: Emergency Medicine

## 2019-11-14 ENCOUNTER — Other Ambulatory Visit: Payer: Self-pay

## 2019-11-14 DIAGNOSIS — E119 Type 2 diabetes mellitus without complications: Secondary | ICD-10-CM | POA: Diagnosis not present

## 2019-11-14 DIAGNOSIS — F419 Anxiety disorder, unspecified: Secondary | ICD-10-CM | POA: Diagnosis not present

## 2019-11-14 DIAGNOSIS — U071 COVID-19: Secondary | ICD-10-CM | POA: Diagnosis not present

## 2019-11-14 DIAGNOSIS — R112 Nausea with vomiting, unspecified: Secondary | ICD-10-CM

## 2019-11-14 DIAGNOSIS — Z79899 Other long term (current) drug therapy: Secondary | ICD-10-CM | POA: Insufficient documentation

## 2019-11-14 DIAGNOSIS — R109 Unspecified abdominal pain: Secondary | ICD-10-CM | POA: Insufficient documentation

## 2019-11-14 LAB — CBC WITH DIFFERENTIAL/PLATELET
Abs Immature Granulocytes: 0.01 10*3/uL (ref 0.00–0.07)
Basophils Absolute: 0 10*3/uL (ref 0.0–0.1)
Basophils Relative: 0 %
Eosinophils Absolute: 0 10*3/uL (ref 0.0–0.5)
Eosinophils Relative: 0 %
HCT: 42.6 % (ref 36.0–46.0)
Hemoglobin: 13.9 g/dL (ref 12.0–15.0)
Immature Granulocytes: 0 %
Lymphocytes Relative: 24 %
Lymphs Abs: 1.6 10*3/uL (ref 0.7–4.0)
MCH: 29.1 pg (ref 26.0–34.0)
MCHC: 32.6 g/dL (ref 30.0–36.0)
MCV: 89.3 fL (ref 80.0–100.0)
Monocytes Absolute: 0.4 10*3/uL (ref 0.1–1.0)
Monocytes Relative: 6 %
Neutro Abs: 4.6 10*3/uL (ref 1.7–7.7)
Neutrophils Relative %: 70 %
Platelets: 264 10*3/uL (ref 150–400)
RBC: 4.77 MIL/uL (ref 3.87–5.11)
RDW: 12.3 % (ref 11.5–15.5)
WBC: 6.6 10*3/uL (ref 4.0–10.5)
nRBC: 0 % (ref 0.0–0.2)

## 2019-11-14 LAB — COMPREHENSIVE METABOLIC PANEL
ALT: 13 U/L (ref 0–44)
AST: 17 U/L (ref 15–41)
Albumin: 3.8 g/dL (ref 3.5–5.0)
Alkaline Phosphatase: 75 U/L (ref 38–126)
Anion gap: 13 (ref 5–15)
BUN: 6 mg/dL (ref 6–20)
CO2: 24 mmol/L (ref 22–32)
Calcium: 8.2 mg/dL — ABNORMAL LOW (ref 8.9–10.3)
Chloride: 98 mmol/L (ref 98–111)
Creatinine, Ser: 0.5 mg/dL (ref 0.44–1.00)
GFR calc non Af Amer: >60 mL/min (ref >60)
Glucose, Bld: 138 mg/dL — ABNORMAL HIGH (ref 70–99)
Potassium: 3.5 mmol/L (ref 3.5–5.1)
Sodium: 135 mmol/L (ref 135–145)
Total Bilirubin: 0.6 mg/dL (ref 0.3–1.2)
Total Protein: 7.4 g/dL (ref 6.5–8.1)

## 2019-11-14 LAB — URINALYSIS, ROUTINE W REFLEX MICROSCOPIC
Bilirubin Urine: NEGATIVE
Glucose, UA: NEGATIVE mg/dL
Ketones, ur: 40 mg/dL — AB
Leukocytes,Ua: NEGATIVE
Nitrite: NEGATIVE
Protein, ur: NEGATIVE mg/dL
Specific Gravity, Urine: 1.02 (ref 1.005–1.030)
pH: 6 (ref 5.0–8.0)

## 2019-11-14 LAB — URINALYSIS, MICROSCOPIC (REFLEX)

## 2019-11-14 LAB — RESPIRATORY PANEL BY RT PCR (FLU A&B, COVID)
Influenza A by PCR: NEGATIVE
Influenza B by PCR: NEGATIVE
SARS Coronavirus 2 by RT PCR: POSITIVE — AB

## 2019-11-14 MED ORDER — ONDANSETRON HCL 4 MG/2ML IJ SOLN
4.0000 mg | Freq: Once | INTRAMUSCULAR | Status: AC
  Administered 2019-11-14: 4 mg via INTRAVENOUS
  Filled 2019-11-14: qty 2

## 2019-11-14 MED ORDER — SODIUM CHLORIDE 0.9 % IV BOLUS
1000.0000 mL | Freq: Once | INTRAVENOUS | Status: AC
  Administered 2019-11-14: 1000 mL via INTRAVENOUS

## 2019-11-14 MED ORDER — PROMETHAZINE HCL 25 MG/ML IJ SOLN
12.5000 mg | Freq: Once | INTRAMUSCULAR | Status: AC
  Administered 2019-11-14: 12.5 mg via INTRAVENOUS
  Filled 2019-11-14: qty 1

## 2019-11-14 NOTE — ED Triage Notes (Addendum)
Pt reports n/v x3 days with lower abdominal soreness since yesterday. PT states on Sunday she had chills, body aches, and headache. Pt states she was tested for covid on Monday and it was negative. Pt was seen yesterday at [facility].

## 2019-11-14 NOTE — ED Notes (Signed)
Pt reports nausea is gone along with abdominal soreness

## 2019-11-14 NOTE — Discharge Instructions (Addendum)
Please continue taking your Zofran as prescribed for breakthrough nausea and vomiting.  You need to make sure that you stay hydrated and eat as much as possible throughout your illness.  I would recommend taking Tylenol for any body aches or fevers.  The monoclonal antibody infusion center is going to reach out to you to schedule a time for your antibody infusion.  If you develop worsening symptoms once again, you need to return to the ER for reevaluation.  It was a pleasure to meet you.

## 2019-11-14 NOTE — ED Provider Notes (Signed)
[facility] EMERGENCY DEPARTMENT Provider Note   CSN: [id number] Arrival date & time: 11/14/19  [id number]     History Chief Complaint  Patient presents with  . Abdominal Pain    Kelsey [name] is a 44 y.o. female.  HPI Patient is a 44 year old female with a medical history as noted below.  Patient states about 7 days ago she began experiencing intractable nausea and vomiting.  She reports some associated chills, myalgias, decreased appetite.  She was seen at [facility] yesterday and had a generally reassuring work-up.  She was discharged on omeprazole, Zofran, potassium.  She states she took a dose of Zofran yesterday but later in the evening began experiencing nausea and vomiting after eating mashed potatoes, so she became anxious and has not taken any additional doses of Zofran.  She reports continued nausea and vomiting this morning.  She presents to the ED once again today for reevaluation.  She has been vaccinated for COVID-19 and tested negative for COVID-19 earlier this week.  No EtOH use and she does not smoke.  She does smoke marijuana.  No fevers, chest pain, constipation, diarrhea, shortness of breath, urinary changes, syncope.    Past Medical History:  Diagnosis Date  . Anxiety   . Diabetes mellitus without complication [facility](HCC)     Patient Active Problem List   Diagnosis Date Noted  . Carpal tunnel syndrome of left wrist 03/07/2012    Past Surgical History:  Procedure Laterality Date  . WISDOM TOOTH EXTRACTION       OB History   No obstetric history on file.     Family History  Problem Relation Age of Onset  . Hypertension Mother   . Hyperlipidemia Mother   . Hypertension Father   . Diabetes Father   . Hyperlipidemia Father   . Hypertension Sister   . Hypertension Brother   . Diabetes Brother   . Heart attack Neg Hx   . Sudden death Neg Hx     Social History   Tobacco Use  . Smoking status: Never Smoker  . Smokeless tobacco: Never Used    Substance Use Topics  . Alcohol use: Yes    Comment: socially  . Drug use: Yes    Types: Marijuana    Home Medications Prior to Admission medications   Medication Sig Start Date End Date Taking? Authorizing Provider  atorvastatin (LIPITOR) 80 MG tablet Take 80 mg by mouth daily. 12/19/18   [provider]  citalopram (CELEXA) 40 MG tablet Take 1 tablet (40 mg total) by mouth daily. 10/08/12   Teressa LowerPickering, Vrinda, NP  hydrochlorothiazide (HYDRODIURIL) 12.5 MG tablet Take 12.5 mg by mouth daily. 01/24/19   [provider]  JARDIANCE 10 MG TABS tablet Take 10 mg by mouth daily. 02/15/19   [provider]  LANTUS SOLOSTAR 100 UNIT/ML Solostar Pen SMARTSIG:0-50 Unit(s) SUB-Q Daily 11/01/18   [provider]  magnesium oxide (MAG-OX) 400 MG tablet Take by mouth. 04/02/18   [provider]  meloxicam (MOBIC) 7.5 MG tablet Take 1 tablet (7.5 mg total) by mouth daily. Take on a full stomach 03/06/12   Hudnall, Azucena FallenShane R, MD  metFORMIN (GLUMETZA) 500 MG (MOD) 24 hr tablet Take 500 mg by mouth daily with breakfast.    [provider]  OXcarbazepine (TRILEPTAL) 150 MG tablet Take 150 mg by mouth 2 (two) times daily. 02/19/19   [provider]  traMADol (ULTRAM) 50 MG tablet Take 1 tablet (50 mg total) by mouth  every 6 (six) hours as needed for pain. 03/03/12   Palumbo, April, MD    Allergies    Ampicillin, Percocet [oxycodone-acetaminophen], Sulfa antibiotics, Tramadol, and Vicodin [hydrocodone-acetaminophen]  Review of Systems   Review of Systems  All other systems reviewed and are negative. Ten systems reviewed and are negative for acute change, except as noted in the HPI.    Physical Exam Updated Vital Signs BP (!) 155/94 (BP Location: Right Arm)   Pulse 85   Temp 98.6 F (37 C) (Oral)   Resp 18   Ht 5' (1.524 m)   Wt 60.3 kg   LMP 11/10/2019   SpO2 100%   BMI 25.97 kg/m   Physical Exam Vitals and nursing note reviewed.   Constitutional:      General: She is not in acute distress.    Appearance: Normal appearance. She is well-developed and normal weight. She is not ill-appearing, toxic-appearing or diaphoretic.  HENT:     Head: Normocephalic and atraumatic.     Right Ear: External ear normal.     Left Ear: External ear normal.     Nose: Nose normal.     Mouth/Throat:     Mouth: Mucous membranes are moist.     Pharynx: Oropharynx is clear. No oropharyngeal exudate or posterior oropharyngeal erythema.  Eyes:     Extraocular Movements: Extraocular movements intact.  Cardiovascular:     Rate and Rhythm: Normal rate and regular rhythm.     Pulses: Normal pulses.     Heart sounds: Normal heart sounds. No murmur heard.  No friction rub. No gallop.      Comments: Regular rate and rhythm.  No murmurs, rubs, gallops. Pulmonary:     Effort: Pulmonary effort is normal. No respiratory distress.     Breath sounds: Normal breath sounds. No stridor. No wheezing, rhonchi or rales.  Abdominal:     General: Abdomen is flat. Bowel sounds are normal.     Palpations: Abdomen is soft.     Tenderness: There is no abdominal tenderness. There is no right CVA tenderness or left CVA tenderness.     Comments: Abdomen is soft and nontender with deep palpation in all 4 abdominal quadrants.  Musculoskeletal:        General: Normal range of motion.     Cervical back: Normal range of motion and neck supple. No tenderness.  Skin:    General: Skin is warm and dry.  Neurological:     General: No focal deficit present.     Mental Status: She is alert and oriented to person, place, and time.  Psychiatric:        Mood and Affect: Mood normal.        Behavior: Behavior normal.    ED Results / Procedures / Treatments   Labs (all labs ordered are listed, but only abnormal results are displayed) Labs Reviewed  RESPIRATORY PANEL BY RT PCR (FLU A&B, COVID) - Abnormal; Notable for the following components:      Result Value   SARS  Coronavirus 2 by RT PCR POSITIVE (*)    All other components within normal limits  URINALYSIS, ROUTINE W REFLEX MICROSCOPIC - Abnormal; Notable for the following components:   Hgb urine dipstick TRACE (*)    Ketones, ur 40 (*)    All other components within normal limits  COMPREHENSIVE METABOLIC PANEL - Abnormal; Notable for the following components:   Glucose, Bld 138 (*)    Calcium 8.2 (*)    All  other components within normal limits  URINALYSIS, MICROSCOPIC (REFLEX) - Abnormal; Notable for the following components:   Bacteria, UA FEW (*)    All other components within normal limits  CBC WITH DIFFERENTIAL/PLATELET    EKG EKG Interpretation  Date/Time:  Friday November 14 2019 13:49:34 EDT Ventricular Rate:  80 PR Interval:    QRS Duration: 78 QT Interval:  382 QTC Calculation: 441 R Axis:   68 Text Interpretation: Sinus rhythm Borderline short PR interval Confirmed by Tilden Fossa [phone]) on 11/14/2019 3:04:54 PM   Radiology No results found.  Procedures Procedures (including critical care time)  Medications Ordered in ED Medications  sodium chloride 0.9 % bolus 1,000 mL ( Intravenous Stopped 11/14/19 1206)  ondansetron (ZOFRAN) injection 4 mg (4 mg Intravenous Given 11/14/19 1104)  promethazine (PHENERGAN) injection 12.5 mg (12.5 mg Intravenous Given 11/14/19 1443)   ED Course  I have reviewed the triage vital signs and the nursing notes.  Pertinent labs & imaging results that were available during my care of the patient were reviewed by me and considered in my medical decision making (see chart for details).  Clinical Course as of Nov 13 1521  Fri Nov 14, 2019  1248 SARS Coronavirus 2 by RT PCR(!): POSITIVE [LJ]    Clinical Course User Index [LJ] Placido Sou, PA-C   MDM Rules/Calculators/A&P                          Pt is a 44 y.o. female that presents with a history, physical exam, and ED Clinical Course as noted above.   Patient presents today with  intractable nausea and vomiting.  She was evaluated for this yesterday at [facility] and ultimately discharged on antiemetics as well as a potassium supplement.  She did not take any Zofran today and returned to the ER for reevaluation.  I repeated her COVID-19 test which was positive today.  She has been vaccinated for COVID-19 with the [employer] vaccine.    Patient with 40 ketones on her UA.  Mild hyperglycemia at 138.  Physical exam generally reassuring.  Not tachycardic.  Lungs are clear to auscultation bilaterally.  No abdominal tenderness on my exam.  She was given a liter of IV fluids as well as Zofran and Phenergan.  She reports significant relief of her symptoms and has been drinking ginger ale and eating crackers in her bed.  No continued vomiting.   Patient is amenable to a monoclonal antibody infusion.  I reached out to the infusion center and they are planning on reaching out to her to schedule a time for her infusion.  Recommended patient stay hydrated and continue to eat and drink as tolerated.  She already has a prescription for Zofran.  Recommended that she take this as needed for breakthrough nausea and vomiting.  She understands she is to return to the ER if she develops any new or worsening symptoms.  We discussed quarantining for 10 to 14 days from symptom onset.  She is afebrile, not tachycardic, not hypoxic.  Her questions were answered and she was amicable at the time of discharge.   An After Visit Summary was printed and given to the patient.  Patient discharged to home/self care.  Condition at discharge: Stable  Note: Portions of this report may have been transcribed using voice recognition software. Every effort was made to ensure accuracy; however, inadvertent computerized transcription errors may be present.   Final Clinical  Impression(s) / ED Diagnoses Final diagnoses:  COVID-19  Non-intractable vomiting with nausea   Rx / DC Orders ED Discharge  Orders    None       Placido Sou, PA-C 11/14/19 1525    Tegeler, Canary Brim, MD 11/14/19 1531

## 2019-11-14 NOTE — ED Notes (Signed)
Pt still unable to void

## 2019-11-14 NOTE — ED Notes (Signed)
Pt aware of urine needed. Pt unable to urinate at this time 

## 2019-11-15 ENCOUNTER — Other Ambulatory Visit (HOSPITAL_COMMUNITY): Payer: Self-pay | Admitting: Nurse Practitioner

## 2019-11-15 ENCOUNTER — Telehealth (HOSPITAL_COMMUNITY): Payer: Self-pay | Admitting: Nurse Practitioner

## 2019-11-15 ENCOUNTER — Ambulatory Visit (HOSPITAL_COMMUNITY)
Admission: RE | Admit: 2019-11-15 | Discharge: 2019-11-15 | Disposition: A | Discharge status: Home / Self Care | Payer: HRSA Program | Source: Ambulatory Visit | Attending: Pulmonary Disease | Admitting: Pulmonary Disease

## 2019-11-15 DIAGNOSIS — U071 COVID-19: Secondary | ICD-10-CM

## 2019-11-15 MED ORDER — SODIUM CHLORIDE 0.9 % IV SOLN: Freq: Once | INTRAVENOUS | Status: DC

## 2019-11-15 MED ORDER — METHYLPREDNISOLONE SODIUM SUCC 125 MG IJ SOLR: 125.0000 mg | Freq: Once | INTRAMUSCULAR | Status: DC | PRN

## 2019-11-15 MED ORDER — ALBUTEROL SULFATE HFA 108 (90 BASE) MCG/ACT IN AERS: 2.0000 | INHALATION_SPRAY | Freq: Once | RESPIRATORY_TRACT | Status: DC | PRN

## 2019-11-15 MED ORDER — FAMOTIDINE IN NACL 20-0.9 MG/50ML-% IV SOLN: 20.0000 mg | Freq: Once | INTRAVENOUS | Status: DC | PRN

## 2019-11-15 MED ORDER — EPINEPHRINE 0.3 MG/0.3ML IJ SOAJ: 0.3000 mg | Freq: Once | INTRAMUSCULAR | Status: DC | PRN

## 2019-11-15 MED ORDER — SODIUM CHLORIDE 0.9 % IV SOLN: Freq: Once | INTRAVENOUS | Status: AC

## 2019-11-15 MED ORDER — SODIUM CHLORIDE 0.9 % IV SOLN: INTRAVENOUS | Status: DC | PRN

## 2019-11-15 MED ORDER — DIPHENHYDRAMINE HCL 50 MG/ML IJ SOLN: 50.0000 mg | Freq: Once | INTRAMUSCULAR | Status: DC | PRN

## 2019-11-15 NOTE — Progress Notes (Signed)
  Diagnosis: COVID-19  Physician: Dr. Wright  Procedure: Covid Infusion Clinic Med: casirivimab\imdevimab infusion - Provided patient with casirivimab\imdevimab fact sheet for patients, parents and caregivers prior to infusion.  Complications: No immediate complications noted.  Discharge: Discharged home   Kelsey [name] [name] 11/15/2019  

## 2019-11-15 NOTE — Discharge Instructions (Signed)

## 2019-11-15 NOTE — Telephone Encounter (Signed)
Called to Discuss with patient about Covid symptoms and the use of regeneron, a monoclonal antibody infusion for those with mild to moderate Covid symptoms and at a high risk of hospitalization.   °  °Pt is qualified for this infusion at the [facility] infusion center due to co-morbid conditions and/or a member of an at-risk group.   °  °Unable to reach pt. Left message to return call. Sent text message.  ° °Darell Saputo, DNP, AGNP-C °[phone] (Infusion Center Hotline) ° °

## 2019-11-15 NOTE — Progress Notes (Signed)
I connected by phone with Kelsey [name] on 11/15/2019 at 10:07 AM to discuss the potential use of an new treatment for mild to moderate COVID-19 viral infection in non-hospitalized patients.  This patient is a 44 y.o. female that meets the FDA criteria for Emergency Use Authorization of casirivimab\imdevimab.  Has a (+) direct SARS-CoV-2 viral test result  Has mild or moderate COVID-19   Is ? 46 years of age and weighs ? 40 kg  Is NOT hospitalized due to COVID-19  Is NOT requiring oxygen therapy or requiring an increase in baseline oxygen flow rate due to COVID-19  Is within 10 days of symptom onset  Has at least one of the high risk factor(s) for progression to severe COVID-19 and/or hospitalization as defined in EUA.  Specific high risk criteria : BMI > 25, Diabetes and Other high risk medical condition per CDC:  SVI   I have spoken and communicated the following to the patient or parent/caregiver:  1. FDA has authorized the emergency use of casirivimab\imdevimab for the treatment of mild to moderate COVID-19 in adults and pediatric patients with positive results of direct SARS-CoV-2 viral testing who are 40 years of age and older weighing at least 40 kg, and who are at high risk for progressing to severe COVID-19 and/or hospitalization.  2. The significant known and potential risks and benefits of casirivimab\imdevimab, and the extent to which such potential risks and benefits are unknown.  3. Information on available alternative treatments and the risks and benefits of those alternatives, including clinical trials.  4. Patients treated with casirivimab\imdevimab should continue to self-isolate and use infection control measures (e.g., wear mask, isolate, social distance, avoid sharing personal items, clean and disinfect "high touch" surfaces, and frequent handwashing) according to CDC guidelines.   5. The patient or parent/caregiver has the option to accept or refuse  casirivimab\imdevimab .  After reviewing this information with the patient, the patient has agreed to receive one of the available covid 19 monoclonal antibodies and will be provided an appropriate fact sheet prior to infusion.Consuello Masse, DNP, AGNP-C [phone] (Infusion Center Hotline)

## 2020-03-18 ENCOUNTER — Encounter (HOSPITAL_BASED_OUTPATIENT_CLINIC_OR_DEPARTMENT_OTHER): Payer: Self-pay | Admitting: Emergency Medicine

## 2020-03-18 ENCOUNTER — Emergency Department (HOSPITAL_BASED_OUTPATIENT_CLINIC_OR_DEPARTMENT_OTHER)
Admission: EM | Admit: 2020-03-18 | Discharge: 2020-03-18 | Disposition: A | Discharge status: Home / Self Care | Payer: No Typology Code available for payment source | Attending: Emergency Medicine | Admitting: Emergency Medicine

## 2020-03-18 ENCOUNTER — Other Ambulatory Visit: Payer: Self-pay

## 2020-03-18 DIAGNOSIS — Z7984 Long term (current) use of oral hypoglycemic drugs: Secondary | ICD-10-CM | POA: Insufficient documentation

## 2020-03-18 DIAGNOSIS — Z794 Long term (current) use of insulin: Secondary | ICD-10-CM | POA: Insufficient documentation

## 2020-03-18 DIAGNOSIS — Z20822 Contact with and (suspected) exposure to covid-19: Secondary | ICD-10-CM | POA: Diagnosis not present

## 2020-03-18 DIAGNOSIS — E1165 Type 2 diabetes mellitus with hyperglycemia: Secondary | ICD-10-CM | POA: Diagnosis not present

## 2020-03-18 DIAGNOSIS — R42 Dizziness and giddiness: Secondary | ICD-10-CM | POA: Insufficient documentation

## 2020-03-18 DIAGNOSIS — R112 Nausea with vomiting, unspecified: Secondary | ICD-10-CM | POA: Diagnosis present

## 2020-03-18 DIAGNOSIS — R197 Diarrhea, unspecified: Secondary | ICD-10-CM | POA: Insufficient documentation

## 2020-03-18 DIAGNOSIS — R739 Hyperglycemia, unspecified: Secondary | ICD-10-CM

## 2020-03-18 DIAGNOSIS — R630 Anorexia: Secondary | ICD-10-CM | POA: Diagnosis not present

## 2020-03-18 LAB — CBC WITH DIFFERENTIAL/PLATELET
Abs Immature Granulocytes: 0.02 10*3/uL (ref 0.00–0.07)
Basophils Absolute: 0 10*3/uL (ref 0.0–0.1)
Basophils Relative: 0 %
Eosinophils Absolute: 0 10*3/uL (ref 0.0–0.5)
Eosinophils Relative: 0 %
HCT: 43 % (ref 36.0–46.0)
Hemoglobin: 14.6 g/dL (ref 12.0–15.0)
Immature Granulocytes: 0 %
Lymphocytes Relative: 32 %
Lymphs Abs: 2.4 10*3/uL (ref 0.7–4.0)
MCH: 30.2 pg (ref 26.0–34.0)
MCHC: 34 g/dL (ref 30.0–36.0)
MCV: 88.8 fL (ref 80.0–100.0)
Monocytes Absolute: 0.5 10*3/uL (ref 0.1–1.0)
Monocytes Relative: 6 %
Neutro Abs: 4.5 10*3/uL (ref 1.7–7.7)
Neutrophils Relative %: 62 %
Platelets: 249 10*3/uL (ref 150–400)
RBC: 4.84 MIL/uL (ref 3.87–5.11)
RDW: 11.8 % (ref 11.5–15.5)
WBC: 7.4 10*3/uL (ref 4.0–10.5)
nRBC: 0 % (ref 0.0–0.2)

## 2020-03-18 LAB — BASIC METABOLIC PANEL
Anion gap: 9 (ref 5–15)
BUN: 6 mg/dL (ref 6–20)
CO2: 24 mmol/L (ref 22–32)
Calcium: 8.8 mg/dL — ABNORMAL LOW (ref 8.9–10.3)
Chloride: 100 mmol/L (ref 98–111)
Creatinine, Ser: 0.79 mg/dL (ref 0.44–1.00)
GFR, Estimated: >60 mL/min (ref >60)
Glucose, Bld: 257 mg/dL — ABNORMAL HIGH (ref 70–99)
Potassium: 3.7 mmol/L (ref 3.5–5.1)
Sodium: 133 mmol/L — ABNORMAL LOW (ref 135–145)

## 2020-03-18 LAB — CBG MONITORING, ED
Glucose-Capillary: 285 mg/dL — ABNORMAL HIGH (ref 70–99)
Glucose-Capillary: 192 mg/dL — ABNORMAL HIGH (ref 70–99)

## 2020-03-18 LAB — SARS CORONAVIRUS 2 (TAT 6-24 HRS): SARS Coronavirus 2: NEGATIVE

## 2020-03-18 MED ORDER — SODIUM CHLORIDE 0.9 % IV BOLUS
1000.0000 mL | Freq: Once | INTRAVENOUS | Status: AC
  Administered 2020-03-18: 1000 mL via INTRAVENOUS

## 2020-03-18 MED ORDER — SODIUM CHLORIDE 0.9 % IV SOLN: INTRAVENOUS | Status: DC

## 2020-03-18 NOTE — ED Notes (Signed)
Pt states she hasnt been taking her diabetic medication for the past few months d/t unemployment. BG 285

## 2020-03-18 NOTE — Discharge Instructions (Signed)
Follow back up with your primary care doctor they should be via to make an arrangement for you to at least get insulin from the pharmacy.  In the meantime stay strict with your diet.  Covid testing is pending should have results by tomorrow.  If not later tonight.  In the meantime isolate yourself.  Work note provided

## 2020-03-18 NOTE — ED Triage Notes (Signed)
Reports having covid in October and has felt the same way the last few days.  C/o n/v/d, chills, dizziness, and decreased appetite.

## 2020-03-18 NOTE — ED Provider Notes (Signed)
[facility] EMERGENCY DEPARTMENT Provider Note   CSN: [id number] Arrival date & time: 03/18/20  9924     History Chief Complaint  Patient presents with  . multiple complaints    Kelsey [name] is a 45 y.o. female.  Patient reports having Covid infection in October states that she has been feeling the same way the last few days.  She has had some nausea vomiting diarrhea chills and some dizziness and decreased appetite.  Patient has not been taking her diabetic medicine for the past few months because she has been unemployed.  Blood sugar here was 285 on arrival.  Patient supposed to be on insulin.  On Tuesday patient had some vomiting and diarrhea chills have persisted on Wednesday patient's voice became very hoarse.  She is also had some dizziness with this as well.  No documented fevers.  Temp here 98.1.  Oxygen saturation on room air is 100%        Past Medical History:  Diagnosis Date  . Anxiety   . Diabetes mellitus without complication [facility])     Patient Active Problem List   Diagnosis Date Noted  . Carpal tunnel syndrome of left wrist 03/07/2012    Past Surgical History:  Procedure Laterality Date  . WISDOM TOOTH EXTRACTION       OB History   No obstetric history on file.     Family History  Problem Relation Age of Onset  . Hypertension Mother   . Hyperlipidemia Mother   . Hypertension Father   . Diabetes Father   . Hyperlipidemia Father   . Hypertension Sister   . Hypertension Brother   . Diabetes Brother   . Heart attack Neg Hx   . Sudden death Neg Hx     Social History   Tobacco Use  . Smoking status: Never Smoker  . Smokeless tobacco: Never Used  Substance Use Topics  . Alcohol use: Yes    Comment: socially  . Drug use: Yes    Types: Marijuana    Home Medications Prior to Admission medications   Medication Sig Start Date End Date Taking? Authorizing Provider  atorvastatin (LIPITOR) 80 MG tablet Take 80 mg by mouth daily. 12/19/18    [provider]  citalopram (CELEXA) 40 MG tablet Take 1 tablet (40 mg total) by mouth daily. 10/08/12   Teressa Lower, NP  hydrochlorothiazide (HYDRODIURIL) 12.5 MG tablet Take 12.5 mg by mouth daily. 01/24/19   [provider]  JARDIANCE 10 MG TABS tablet Take 10 mg by mouth daily. 02/15/19   [provider]  LANTUS SOLOSTAR 100 UNIT/ML Solostar Pen SMARTSIG:0-50 Unit(s) SUB-Q Daily 11/01/18   [provider]  magnesium oxide (MAG-OX) 400 MG tablet Take by mouth. 04/02/18   [provider]  meloxicam (MOBIC) 7.5 MG tablet Take 1 tablet (7.5 mg total) by mouth daily. Take on a full stomach 03/06/12   Hudnall, Azucena Fallen, MD  metFORMIN (GLUMETZA) 500 MG (MOD) 24 hr tablet Take 500 mg by mouth daily with breakfast.    [provider]  OXcarbazepine (TRILEPTAL) 150 MG tablet Take 150 mg by mouth 2 (two) times daily. 02/19/19   [provider]  traMADol (ULTRAM) 50 MG tablet Take 1 tablet (50 mg total) by mouth every 6 (six) hours as needed for pain. 03/03/12   Palumbo, April, MD    Allergies    Ampicillin, Percocet [oxycodone-acetaminophen], Sulfa antibiotics, Tramadol, and Vicodin [hydrocodone-acetaminophen]  Review of Systems   Review of Systems  Constitutional: Positive for chills and fatigue. Negative for fever.  HENT: Positive for sore throat. Negative for congestion and rhinorrhea.   Eyes: Negative for visual disturbance.  Respiratory: Negative for cough and shortness of breath.   Cardiovascular: Negative for chest pain and leg swelling.  Gastrointestinal: Positive for diarrhea and nausea. Negative for abdominal pain and vomiting.  Genitourinary: Negative for dysuria.  Musculoskeletal: Negative for back pain and neck pain.  Skin: Negative for rash.  Neurological: Positive for dizziness. Negative for light-headedness and headaches.  Hematological: Does not bruise/bleed easily.  Psychiatric/Behavioral: Negative for confusion.     Physical Exam Updated Vital Signs BP (!) 160/96   Pulse 84   Temp 98.1 F (36.7 C) (Oral)   Resp 18   Ht 1.524 m (5')   Wt 63.5 kg   SpO2 100%   BMI 27.34 kg/m   Physical Exam Vitals and nursing note reviewed.  Constitutional:      General: She is not in acute distress.    Appearance: Normal appearance. She is well-developed and well-nourished.  HENT:     Head: Normocephalic and atraumatic.  Eyes:     Extraocular Movements: Extraocular movements intact.     Conjunctiva/sclera: Conjunctivae normal.     Pupils: Pupils are equal, round, and reactive to light.  Cardiovascular:     Rate and Rhythm: Normal rate and regular rhythm.     Heart sounds: No murmur heard.   Pulmonary:     Effort: Pulmonary effort is normal. No respiratory distress.     Breath sounds: Normal breath sounds.  Abdominal:     Palpations: Abdomen is soft.     Tenderness: There is no abdominal tenderness.  Musculoskeletal:        General: No edema. Normal range of motion.     Cervical back: Normal range of motion and neck supple.  Skin:    General: Skin is warm and dry.     Capillary Refill: Capillary refill takes less than 2 seconds.  Neurological:     General: No focal deficit present.     Mental Status: She is alert and oriented to person, place, and time.     Cranial Nerves: No cranial nerve deficit.     Sensory: No sensory deficit.  Psychiatric:        Mood and Affect: Mood and affect normal.     ED Results / Procedures / Treatments   Labs (all labs ordered are listed, but only abnormal results are displayed) Labs Reviewed  BASIC METABOLIC PANEL - Abnormal; Notable for the following components:      Result Value   Sodium 133 (*)    Glucose, Bld 257 (*)    Calcium 8.8 (*)    All other components within normal limits  CBG MONITORING, ED - Abnormal; Notable for the following components:   Glucose-Capillary 285 (*)    All other components within normal limits  CBG MONITORING, ED -  Abnormal; Notable for the following components:   Glucose-Capillary 192 (*)    All other components within normal limits  SARS CORONAVIRUS 2 (TAT 6-24 HRS)  CBC WITH DIFFERENTIAL/PLATELET    EKG None  Radiology No results found.  Procedures Procedures   Medications Ordered in ED Medications  0.9 %  sodium chloride infusion ( Intravenous New Bag/Given 03/18/20 1226)  sodium chloride 0.9 % bolus 1,000 mL (0 mLs Intravenous Stopped 03/18/20 1145)    ED Course  I have reviewed the triage vital signs and the nursing notes.  Pertinent labs & imaging results that were available during my care of the patient were reviewed by me and considered in my medical decision making (see chart for details).    MDM Rules/Calculators/A&P                          Patient nontoxic no acute distress here good oxygen saturations clinically may very well have omicron variant of Covid.  Have sent 624-hour test patient will follow up with it.  Blood sugars were elevated but came down to 192 just with fluids.  Patient is going to watch her diet carefully contact her primary care doctor because normally arrangements can be made to get insulin through the pharmacy at almost no cost.  No evidence of any acidosis.  Patient stable for discharge home will isolate at home and work note provided.     Final Clinical Impression(s) / ED Diagnoses Final diagnoses:  Hyperglycemia  Suspected COVID-19 virus infection    Rx / DC Orders ED Discharge Orders    None       Vanetta Mulders, MD 03/18/20 1448

## 2021-08-13 IMAGING — DX DG CHEST 1V PORT
1 series · 1 of 1 positions shown · non-contrast
Comparison: 03/19/2015

CLINICAL DATA: 43-year-old female with a history of myalgia

EXAM:
PORTABLE CHEST 1 VIEW

[chest ap]
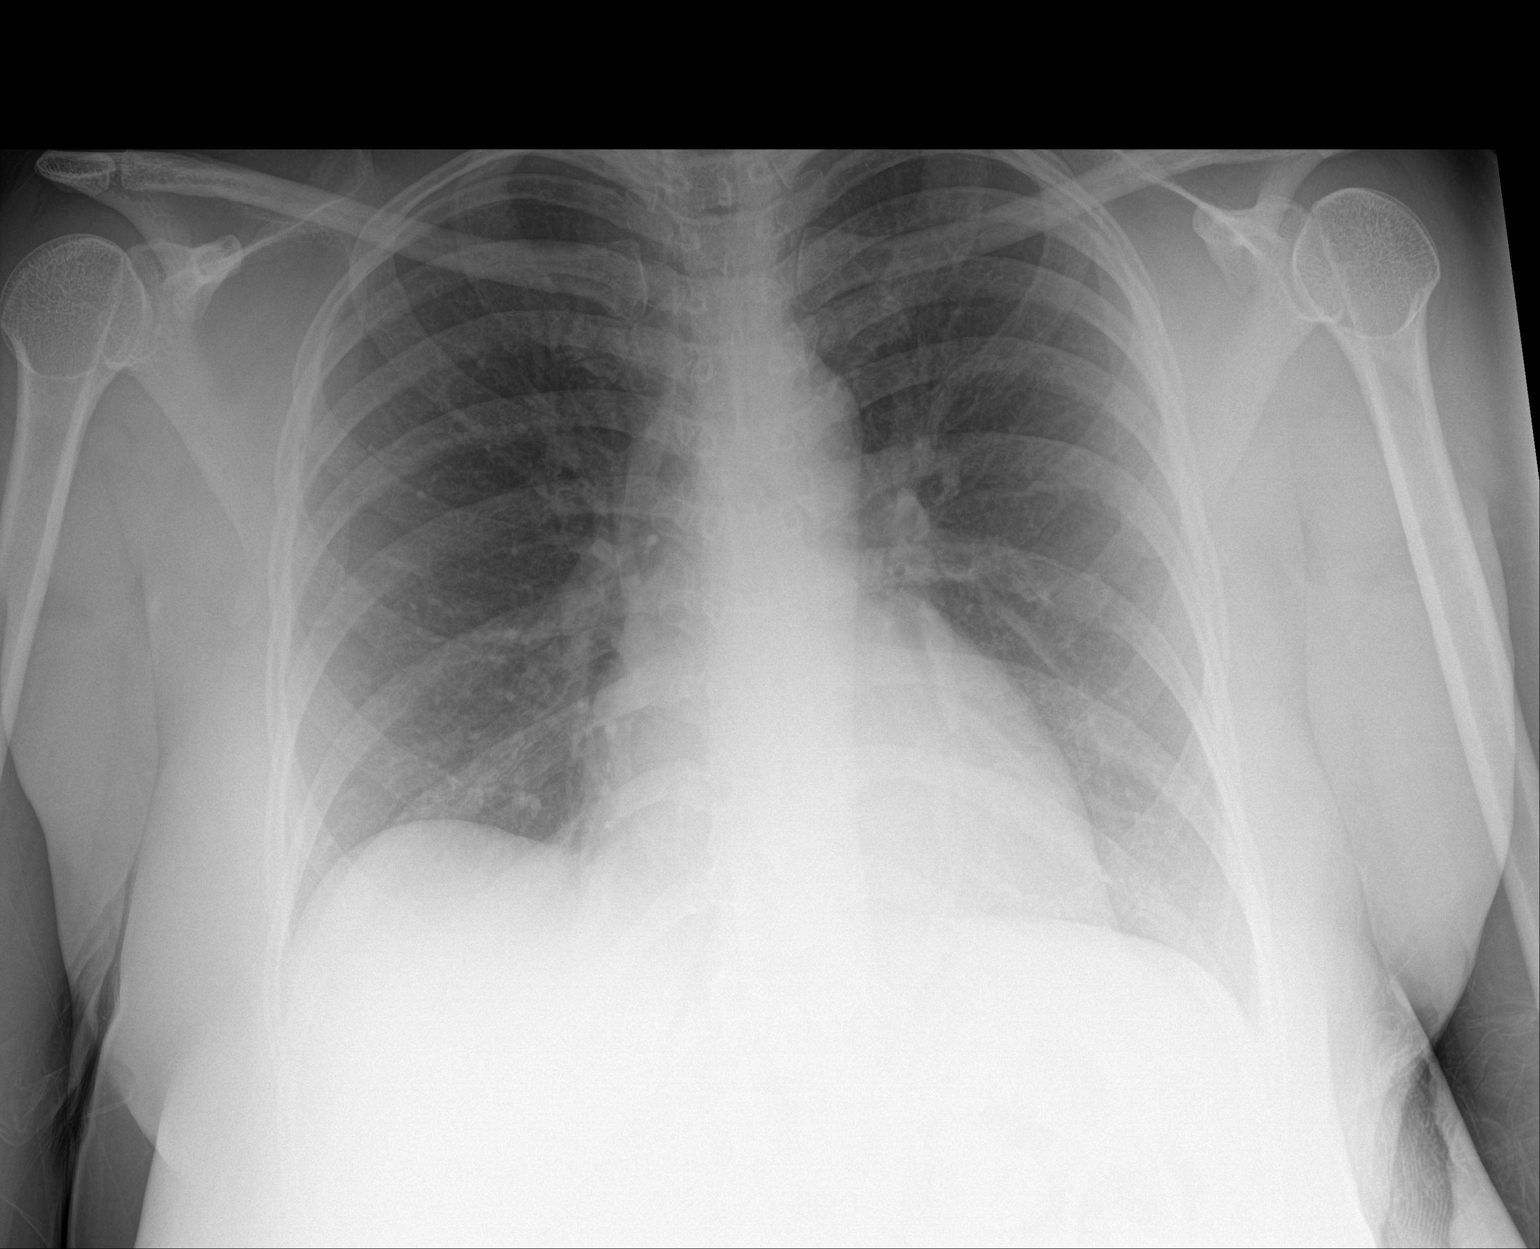

[1 of 1 positions shown; findings below may reference images not displayed]

FINDINGS: Cardiomediastinal silhouette unchanged in size and contour. No
pneumothorax or pleural effusion. No confluent airspace disease. No
displaced fracture
IMPRESSION: Negative for acute cardiopulmonary disease

## 2022-10-12 ENCOUNTER — Encounter (HOSPITAL_BASED_OUTPATIENT_CLINIC_OR_DEPARTMENT_OTHER): Payer: Self-pay | Admitting: Urology

## 2022-10-12 ENCOUNTER — Other Ambulatory Visit: Payer: Self-pay

## 2022-10-12 ENCOUNTER — Emergency Department (HOSPITAL_BASED_OUTPATIENT_CLINIC_OR_DEPARTMENT_OTHER)
Admission: EM | Admit: 2022-10-12 | Discharge: 2022-10-12 | Disposition: A | Discharge status: Home / Self Care | Payer: No Typology Code available for payment source | Attending: Emergency Medicine | Admitting: Emergency Medicine

## 2022-10-12 DIAGNOSIS — E876 Hypokalemia: Secondary | ICD-10-CM | POA: Insufficient documentation

## 2022-10-12 DIAGNOSIS — Z7984 Long term (current) use of oral hypoglycemic drugs: Secondary | ICD-10-CM | POA: Insufficient documentation

## 2022-10-12 DIAGNOSIS — E119 Type 2 diabetes mellitus without complications: Secondary | ICD-10-CM | POA: Insufficient documentation

## 2022-10-12 DIAGNOSIS — R112 Nausea with vomiting, unspecified: Secondary | ICD-10-CM | POA: Insufficient documentation

## 2022-10-12 DIAGNOSIS — R1084 Generalized abdominal pain: Secondary | ICD-10-CM

## 2022-10-12 DIAGNOSIS — D72829 Elevated white blood cell count, unspecified: Secondary | ICD-10-CM | POA: Insufficient documentation

## 2022-10-12 LAB — COMPREHENSIVE METABOLIC PANEL
ALT: 27 U/L (ref 0–44)
AST: 25 U/L (ref 15–41)
Albumin: 5 g/dL (ref 3.5–5.0)
Alkaline Phosphatase: 92 U/L (ref 38–126)
Anion gap: 23 — ABNORMAL HIGH (ref 5–15)
BUN: 15 mg/dL (ref 6–20)
CO2: 19 mmol/L — ABNORMAL LOW (ref 22–32)
Calcium: 9.7 mg/dL (ref 8.9–10.3)
Chloride: 93 mmol/L — ABNORMAL LOW (ref 98–111)
Creatinine, Ser: 1.07 mg/dL — ABNORMAL HIGH (ref 0.44–1.00)
GFR, Estimated: >60 mL/min (ref >60)
Glucose, Bld: 132 mg/dL — ABNORMAL HIGH (ref 70–99)
Potassium: 3.2 mmol/L — ABNORMAL LOW (ref 3.5–5.1)
Sodium: 135 mmol/L (ref 135–145)
Total Bilirubin: 1.4 mg/dL — ABNORMAL HIGH (ref 0.3–1.2)
Total Protein: 9.6 g/dL — ABNORMAL HIGH (ref 6.5–8.1)

## 2022-10-12 LAB — CBC
HCT: 49.4 % — ABNORMAL HIGH (ref 36.0–46.0)
Hemoglobin: 16.3 g/dL — ABNORMAL HIGH (ref 12.0–15.0)
MCH: 30.2 pg (ref 26.0–34.0)
MCHC: 33 g/dL (ref 30.0–36.0)
MCV: 91.5 fL (ref 80.0–100.0)
Platelets: 302 10*3/uL (ref 150–400)
RBC: 5.4 MIL/uL — ABNORMAL HIGH (ref 3.87–5.11)
RDW: 13.2 % (ref 11.5–15.5)
WBC: 12.6 10*3/uL — ABNORMAL HIGH (ref 4.0–10.5)
nRBC: 0 % (ref 0.0–0.2)

## 2022-10-12 LAB — CBG MONITORING, ED: Glucose-Capillary: 158 mg/dL — ABNORMAL HIGH (ref 70–99)

## 2022-10-12 LAB — LIPASE, BLOOD: Lipase: 27 U/L (ref 11–51)

## 2022-10-12 MED ORDER — SODIUM CHLORIDE 0.9 % IV BOLUS
1000.0000 mL | Freq: Once | INTRAVENOUS | Status: AC
  Administered 2022-10-12: 1000 mL via INTRAVENOUS

## 2022-10-12 MED ORDER — PROMETHAZINE HCL 25 MG/ML IJ SOLN
INTRAMUSCULAR | Status: AC
  Filled 2022-10-12: qty 1

## 2022-10-12 MED ORDER — ONDANSETRON HCL 4 MG/2ML IJ SOLN
4.0000 mg | Freq: Once | INTRAMUSCULAR | Status: AC | PRN
  Administered 2022-10-12: 4 mg via INTRAVENOUS
  Filled 2022-10-12: qty 2

## 2022-10-12 MED ORDER — POTASSIUM CHLORIDE CRYS ER 20 MEQ PO TBCR
40.0000 meq | EXTENDED_RELEASE_TABLET | Freq: Once | ORAL | Status: AC
  Administered 2022-10-12: 40 meq via ORAL
  Filled 2022-10-12: qty 2

## 2022-10-12 MED ORDER — DIPHENHYDRAMINE HCL 50 MG/ML IJ SOLN
25.0000 mg | Freq: Once | INTRAMUSCULAR | Status: AC
  Administered 2022-10-12: 25 mg via INTRAVENOUS
  Filled 2022-10-12: qty 1

## 2022-10-12 MED ORDER — SODIUM CHLORIDE 0.9 % IV SOLN
25.0000 mg | Freq: Four times a day (QID) | INTRAVENOUS | Status: DC | PRN
  Administered 2022-10-12: 25 mg via INTRAVENOUS
  Filled 2022-10-12: qty 1

## 2022-10-12 MED ORDER — MORPHINE SULFATE (PF) 4 MG/ML IV SOLN
4.0000 mg | Freq: Once | INTRAVENOUS | Status: AC
  Administered 2022-10-12: 4 mg via INTRAVENOUS
  Filled 2022-10-12: qty 1

## 2022-10-12 MED ORDER — ONDANSETRON 4 MG PO TBDP: 4.0000 mg | ORAL_TABLET | Freq: Four times a day (QID) | ORAL | 0 refills | Status: AC | PRN

## 2022-10-12 NOTE — ED Triage Notes (Signed)
Pt states emesis x 2 days after switching meds from ozempic to [facility] for DM Unknown last CBG  Pt fatigued and continued nausea and vomting at triage

## 2022-10-12 NOTE — ED Provider Notes (Signed)
[place] [facility] Provider Note   CSN: [id number] Arrival date & time: 10/12/22  1112     History  Chief Complaint  Patient presents with   Emesis    Kelsey [name] is a 47 y.o. female past medical history as needed for diabetes, anxiety presents with concern for vomiting for 2 days after switching medication from Ozempic to [facility] for diabetes.  Previously with A1c of 14.  Patient endorsing fatigue, to be nausea, vomiting, persistent since Tuesday.  Patient reports that she has not been able to keep anything on her stomach for several days.  She denies any blood in her vomit.  She endorses pain all over the body.   Emesis      Home Medications Prior to Admission medications   Medication Sig Start Date End Date Taking? Authorizing Provider  ondansetron (ZOFRAN-ODT) 4 MG disintegrating tablet Take 1 tablet (4 mg total) by mouth every 6 (six) hours as needed for nausea or vomiting. 10/12/22  Yes Vala Raffo H, PA-C  atorvastatin (LIPITOR) 80 MG tablet Take 80 mg by mouth daily. 12/19/18   [provider]  citalopram (CELEXA) 40 MG tablet Take 1 tablet (40 mg total) by mouth daily. 10/08/12   Teressa Lower, NP  hydrochlorothiazide (HYDRODIURIL) 12.5 MG tablet Take 12.5 mg by mouth daily. 01/24/19   [provider]  JARDIANCE 10 MG TABS tablet Take 10 mg by mouth daily. 02/15/19   [provider]  LANTUS SOLOSTAR 100 UNIT/ML Solostar Pen SMARTSIG:0-50 Unit(s) SUB-Q Daily 11/01/18   [provider]  magnesium oxide (MAG-OX) 400 MG tablet Take by mouth. 04/02/18   [provider]  meloxicam (MOBIC) 7.5 MG tablet Take 1 tablet (7.5 mg total) by mouth daily. Take on a full stomach 03/06/12   Hudnall, Azucena Fallen, MD  metFORMIN (GLUMETZA) 500 MG (MOD) 24 hr tablet Take 500 mg by mouth daily with breakfast.    [provider]  OXcarbazepine (TRILEPTAL) 150 MG tablet Take 150 mg by mouth 2 (two) times  daily. 02/19/19   [provider]  traMADol (ULTRAM) 50 MG tablet Take 1 tablet (50 mg total) by mouth every 6 (six) hours as needed for pain. 03/03/12   Palumbo, April, MD      Allergies    Ampicillin, Percocet [oxycodone-acetaminophen], Sulfa antibiotics, Tramadol, and Vicodin [hydrocodone-acetaminophen]    Review of Systems   Review of Systems  Gastrointestinal:  Positive for vomiting.  All other systems reviewed and are negative.   Physical Exam Updated Vital Signs BP 111/72   Pulse (!) 103   Temp 97.8 F (36.6 C) (Oral)   Resp 18   Ht 5' (1.524 m)   Wt 63.5 kg   SpO2 97%   BMI 27.34 kg/m  Physical Exam Vitals and nursing note reviewed.  Constitutional:      General: She is not in acute distress.    Appearance: Normal appearance.  HENT:     Head: Normocephalic and atraumatic.     Mouth/Throat:     Mouth: Mucous membranes are dry.  Eyes:     General:        Right eye: No discharge.        Left eye: No discharge.  Cardiovascular:     Rate and Rhythm: Regular rhythm. Tachycardia present.     Heart sounds: No murmur heard.    No friction rub. No gallop.  Pulmonary:     Effort: Pulmonary effort is normal.  Breath sounds: Normal breath sounds.  Abdominal:     General: Bowel sounds are normal.     Palpations: Abdomen is soft.     Comments: Diffuse tenderness throughout the abdomen, no rebound, rigidity, guarding or distention noted  Skin:    General: Skin is warm and dry.     Capillary Refill: Capillary refill takes less than 2 seconds.  Neurological:     Mental Status: She is alert and oriented to person, place, and time.  Psychiatric:        Mood and Affect: Mood normal.        Behavior: Behavior normal.     ED Results / Procedures / Treatments   Labs (all labs ordered are listed, but only abnormal results are displayed) Labs Reviewed  COMPREHENSIVE METABOLIC PANEL - Abnormal; Notable for the following components:      Result Value    Potassium 3.2 (*)    Chloride 93 (*)    CO2 19 (*)    Glucose, Bld 132 (*)    Creatinine, Ser 1.07 (*)    Total Protein 9.6 (*)    Total Bilirubin 1.4 (*)    Anion gap 23 (*)    All other components within normal limits  CBC - Abnormal; Notable for the following components:   WBC 12.6 (*)    RBC 5.40 (*)    Hemoglobin 16.3 (*)    HCT 49.4 (*)    All other components within normal limits  CBG MONITORING, ED - Abnormal; Notable for the following components:   Glucose-Capillary 158 (*)    All other components within normal limits  LIPASE, BLOOD  URINALYSIS, ROUTINE W REFLEX MICROSCOPIC  PREGNANCY, URINE    EKG None  Radiology No results found.  Procedures Procedures    Medications Ordered in ED Medications  promethazine (PHENERGAN) 25 mg in sodium chloride 0.9 % 50 mL IVPB (0 mg Intravenous Stopped 10/12/22 1321)  promethazine (PHENERGAN) 25 MG/ML injection (  Not Given 10/12/22 1242)  ondansetron (ZOFRAN) injection 4 mg (4 mg Intravenous Given 10/12/22 1149)  sodium chloride 0.9 % bolus 1,000 mL (0 mLs Intravenous Stopped 10/12/22 1401)  morphine (PF) 4 MG/ML injection 4 mg (4 mg Intravenous Given 10/12/22 1232)  diphenhydrAMINE (BENADRYL) injection 25 mg (25 mg Intravenous Given 10/12/22 1232)  sodium chloride 0.9 % bolus 1,000 mL (1,000 mLs Intravenous New Bag/Given 10/12/22 1401)  potassium chloride SA (KLOR-CON M) CR tablet 40 mEq (40 mEq Oral Given 10/12/22 1500)    ED Course/ Medical Decision Making/ A&P                                 Medical Decision Making Amount and/or Complexity of Data Reviewed Labs: ordered.  Risk Prescription drug management.   This patient is a 47 y.o. female  who presents to the ED for concern of abdominal pain, nausea, vomiting 2/2 recent switch to mounjaro.   Differential diagnoses prior to evaluation: The emergent differential diagnosis includes, but is not limited to,  The causes of generalized abdominal pain include but are not limited  to AAA, mesenteric ischemia, appendicitis, diverticulitis, DKA, gastritis, gastroenteritis, AMI, nephrolithiasis, pancreatitis, peritonitis, adrenal insufficiency,lead poisoning, iron toxicity, intestinal ischemia, constipation, UTI,SBO/LBO, splenic rupture, biliary disease, IBD, IBS, PUD, or hepatitis -- overall with high suspicion for symptoms related to medication, however . This is not an exhaustive differential.   Past Medical History / Co-morbidities / Social History: Diabetes,  anxiety  Physical Exam: Physical exam performed. The pertinent findings include: Initially with dry mucous membranes, tachycardia, diffuse abdominal tenderness, patient quite uncomfortable, on reevaluation after fluids, pain medication, nausea medication patient feeling significantly improved.  Tachycardia significantly improved.  Lab Tests/Imaging studies: I personally interpreted labs/imaging and the pertinent results include: CBC notable for mild leukocytosis, white blood cells 12.6, hemoconcentrated with hemoglobin at 16.3 as well suggesting dehydration.  CMP notable for hypokalemia, potassium 3.2, bicarb deficit, CO2 19, with mildly elevated bilirubin 1.4, anion gap likely secondary to her vomiting, dehydration, hyperventilation.    Medications: I ordered medication including, fluids, nausea medication, discharged able to tolerate p.o. and with nausea medication, encouraged to follow up with endocrinologist to discuss medication changes.  I have reviewed the patients home medicines and have made adjustments as needed.   Disposition: After consideration of the diagnostic results and the patients response to treatment, I feel that patient is stable for discharge at this time, nausea and vomiting improved, patient tolerating PO, abdominal pain significantly improved .   emergency department workup does not suggest an emergent condition requiring admission or immediate intervention beyond what has been performed at  this time. The plan is: as above. The patient is safe for discharge and has been instructed to return immediately for worsening symptoms, change in symptoms or any other concerns.  Final Clinical Impression(s) / ED Diagnoses Final diagnoses:  Nausea and vomiting in adult patient  Generalized abdominal pain    Rx / DC Orders ED Discharge Orders          Ordered    ondansetron (ZOFRAN-ODT) 4 MG disintegrating tablet  Every 6 hours PRN        10/12/22 1438              Raydon Chappuis, Calcium H, PA-C 10/12/22 1504    Pricilla Loveless, MD 10/13/22 1955

## 2022-10-12 NOTE — Discharge Instructions (Addendum)
Please reach out to your PCP or endocrinologist regarding changing your diabetes medication as it seems that you are not currently tolerating the Mounjaro.  You can take the nausea medication I have prescribed as needed. It is most effective at least 30 minutes prior to eating.
